# Patient Record
Sex: Male | Born: 1965
Health system: Southern US, Community
[De-identification: ages and names within clinical notes are randomized; demographics above are authoritative.]

## PROBLEM LIST (undated history)

## (undated) DIAGNOSIS — E7800 Pure hypercholesterolemia, unspecified: Secondary | ICD-10-CM

## (undated) DIAGNOSIS — I10 Essential (primary) hypertension: Secondary | ICD-10-CM

## (undated) DIAGNOSIS — E119 Type 2 diabetes mellitus without complications: Secondary | ICD-10-CM

## (undated) HISTORY — PX: NO PAST SURGERIES: SHX2092

---

## 2000-10-25 ENCOUNTER — Encounter: Payer: Self-pay | Admitting: Internal Medicine

## 2000-10-25 ENCOUNTER — Ambulatory Visit (HOSPITAL_COMMUNITY): Admission: RE | Admit: 2000-10-25 | Discharge: 2000-10-25 | Payer: Self-pay | Admitting: Internal Medicine

## 2006-11-14 ENCOUNTER — Ambulatory Visit: Admission: RE | Admit: 2006-11-14 | Discharge: 2006-11-14 | Payer: Self-pay | Admitting: Hematology & Oncology

## 2006-11-29 ENCOUNTER — Ambulatory Visit: Payer: Self-pay | Admitting: Pulmonary Disease

## 2010-08-19 NOTE — Procedures (Signed)
Andrew Duarte, Andrew Duarte               ACCOUNT NO.:  [id number]   MEDICAL RECORD NO.:  [id number]          PATIENT TYPE:  OUT   LOCATION:  SLEEP LAB                     FACILITY:  [facility]   PHYSICIAN:  Kathee Delton, MD,FCCPDATE OF BIRTH:  May 21, 1965   DATE OF STUDY:  11/14/2006                            NOCTURNAL POLYSOMNOGRAM   REFERRING PHYSICIAN:  Shanon Brow L. Wilburn Cornelia, M.D.   REFERRING PHYSICIAN:  Early Chars. Wilburn Cornelia, M.D.   INDICATIONS FOR STUDY:  Hypersomnia with sleep apnea.   RESULTS:  Epworth score is 16.   Sleep architecture:  The patient had a total sleep time of 360 minutes  with decreased slow wave sleep for age and also decreased REM.  Sleep  onset latency was normal and REM onset was normal as well.  Sleep  efficiency was fairly good at 91%.   Respiratory data:  The patient underwent split night protocol where he  was found to have 45 obstructive events in the first 222 minutes of  sleep.  This gave him an apnea/hypopnea index of 12 events per hour over  the first half of the night.  The events occurred primarily in the  supine position and there was loud snoring noted throughout.  By  protocol the patient was then placed on a medium ComfortGel nasal CPAP  mask and titrated ultimately to a final pressure of 7 cm with good  control of events even through supine REM.  The patient then began to  complain of nasal congestion asked for the CPAP to be discontinued for  the rest of the study.   Oxygen data:  There was O2 desaturation as low as 82% with the patient's  obstructive events.   Cardiac:  No clinically significant cardiac arrhythmias were noted.   Movement/parasomnia:  The patient was found have 41 leg jerks with 2.3  per hour resulting in arousal awakening.   IMPRESSION/RECOMMENDATIONS:  The split night study reveals mild  obstructive sleep apnea/hypopnea syndrome with an apnea/hypopnea index  of 12 events per hour and oxygen desaturation as low as 82% during  the  first half of the night.  The patient was then placed on continuous  positive airway pressure  with a medium ComfortGel nasal continuous  positive airway pressure mask and titrated to 7 cmH2O pressure with  excellent control of his obstructive events even through supine REM.  The patient then complained of nasal congestion and asked that the  continuous positive airway pressure be discontinued for the remainder of  the study.  It appears the patient will do  well on a continuous positive airway pressure of 7 if this treatment  modality is chosen.  However, would consider a full face mask in light  of his nasal congestion or possibly improving his nasal airway if this  is an issue.      Kathee Delton, MD,FCCP  Diplomate, [place] Board of Sleep  Medicine  Electronically Signed     [facility]/MEDQ  D:  11/25/2006 18:29:49  T:  11/27/2006 06:37:36  Job:  [id number]

## 2015-04-04 ENCOUNTER — Encounter: Payer: Self-pay | Admitting: Family Medicine

## 2015-04-04 ENCOUNTER — Ambulatory Visit (INDEPENDENT_AMBULATORY_CARE_PROVIDER_SITE_OTHER): Payer: BLUE CROSS/BLUE SHIELD | Admitting: Family Medicine

## 2015-04-04 VITALS — BP 122/80 | Temp 99.4°F | Wt 212.1 lb

## 2015-04-04 DIAGNOSIS — Z125 Encounter for screening for malignant neoplasm of prostate: Secondary | ICD-10-CM | POA: Diagnosis not present

## 2015-04-04 DIAGNOSIS — L729 Follicular cyst of the skin and subcutaneous tissue, unspecified: Secondary | ICD-10-CM

## 2015-04-04 DIAGNOSIS — Z79899 Other long term (current) drug therapy: Secondary | ICD-10-CM | POA: Diagnosis not present

## 2015-04-04 DIAGNOSIS — Z13220 Encounter for screening for lipoid disorders: Secondary | ICD-10-CM | POA: Diagnosis not present

## 2015-04-04 DIAGNOSIS — R739 Hyperglycemia, unspecified: Secondary | ICD-10-CM

## 2015-04-04 LAB — POCT GLUCOSE (DEVICE FOR HOME USE): POC Glucose: 186 mg/dl — AB (ref 70–99)

## 2015-04-04 NOTE — Progress Notes (Signed)
   Subjective:    Patient ID: Andrew Duarte, male    DOB: Nov 23, 1965, 49 y.o.   MRN: [id number]  HPI Patient is here today because he has a knot on his forehead. Patient states that it has been present for over 1 year now.  No pain noted. Treatments tried: none. Came up abpout a yr ago  Pt thought he hit his head there, but then it did not go away   [id number] Patient states that he has no other concerns at this time.   Patient has also not been seen for several years despite diagnosis of type 2 diabetes Review of Systems No headache no chest pain no shortness breath no abdominal pain ROS otherwise negative    Objective:   Physical Exam  Alert vitals stable. HEENT cyst versus lipoma right anterior forehead lungs clear heart rare rhythm.  glucose 186      Assessment & Plan:  Impression subcutaneous cyst discussed #2 type 2 diabetes with noncompliance discussed plan appropriate blood work diet exercise discussed dermatology referral [facility]

## 2015-04-10 ENCOUNTER — Encounter: Payer: Self-pay | Admitting: Family Medicine

## 2015-04-29 LAB — BASIC METABOLIC PANEL
BUN/Creatinine Ratio: 8 — ABNORMAL LOW (ref 9–20)
BUN: 9 mg/dL (ref 6–24)
CO2: 22 mmol/L (ref 18–29)
Calcium: 9.7 mg/dL (ref 8.7–10.2)
Chloride: 99 mmol/L (ref 96–106)
Creatinine, Ser: 1.13 mg/dL (ref 0.76–1.27)
GFR calc Af Amer: 87 mL/min/{1.73_m2} (ref >59)
GFR calc non Af Amer: 75 mL/min/{1.73_m2} (ref >59)
Glucose: 275 mg/dL — ABNORMAL HIGH (ref 65–99)
Potassium: 4.5 mmol/L (ref 3.5–5.2)
Sodium: 140 mmol/L (ref 134–144)

## 2015-04-29 LAB — HEPATIC FUNCTION PANEL
ALT: 38 IU/L (ref 0–44)
AST: 37 IU/L (ref 0–40)
Albumin: 4.5 g/dL (ref 3.5–5.5)
Alkaline Phosphatase: 66 IU/L (ref 39–117)
Bilirubin Total: 0.6 mg/dL (ref 0.0–1.2)
Bilirubin, Direct: 0.15 mg/dL (ref 0.00–0.40)
Total Protein: 7.6 g/dL (ref 6.0–8.5)

## 2015-04-29 LAB — LIPID PANEL
Chol/HDL Ratio: 7 ratio units — ABNORMAL HIGH (ref 0.0–5.0)
Cholesterol, Total: 231 mg/dL — ABNORMAL HIGH (ref 100–199)
HDL: 33 mg/dL — ABNORMAL LOW (ref >39)
LDL Calculated: 152 mg/dL — ABNORMAL HIGH (ref 0–99)
Triglycerides: 230 mg/dL — ABNORMAL HIGH (ref 0–149)
VLDL Cholesterol Cal: 46 mg/dL — ABNORMAL HIGH (ref 5–40)

## 2015-04-29 LAB — HEMOGLOBIN A1C
Est. average glucose Bld gHb Est-mCnc: 341 mg/dL
Hgb A1c MFr Bld: 13.5 % — ABNORMAL HIGH (ref 4.8–5.6)

## 2015-04-29 LAB — PSA: Prostate Specific Ag, Serum: 0.5 ng/mL (ref 0.0–4.0)

## 2015-05-03 ENCOUNTER — Ambulatory Visit (INDEPENDENT_AMBULATORY_CARE_PROVIDER_SITE_OTHER): Payer: BLUE CROSS/BLUE SHIELD | Admitting: Family Medicine

## 2015-05-03 ENCOUNTER — Encounter: Payer: Self-pay | Admitting: Family Medicine

## 2015-05-03 VITALS — BP 146/92 | Ht 71.0 in | Wt 215.4 lb

## 2015-05-03 DIAGNOSIS — E785 Hyperlipidemia, unspecified: Secondary | ICD-10-CM | POA: Insufficient documentation

## 2015-05-03 DIAGNOSIS — R030 Elevated blood-pressure reading, without diagnosis of hypertension: Secondary | ICD-10-CM

## 2015-05-03 DIAGNOSIS — E119 Type 2 diabetes mellitus without complications: Secondary | ICD-10-CM

## 2015-05-03 DIAGNOSIS — IMO0001 Reserved for inherently not codable concepts without codable children: Secondary | ICD-10-CM | POA: Insufficient documentation

## 2015-05-03 MED ORDER — BLOOD GLUCOSE MONITOR SYSTEM W/DEVICE KIT: PACK | Status: AC

## 2015-05-03 MED ORDER — METFORMIN HCL 500 MG PO TABS: 500.0000 mg | ORAL_TABLET | Freq: Two times a day (BID) | ORAL | Status: DC

## 2015-05-03 NOTE — Progress Notes (Signed)
Subjective:    Patient ID: Andrew Duarte, male    DOB: 1965-06-27, 50 y.o.   MRN: [id number] Patient arrives office for follow-up in regards to last visit with multiple concerns.   HPI Patient in today for a 4 week recheck of cyst of subcutaneous tissue to forehead. A bit worried that the dermatologists in [place] recommended he see a Psychiatric nurse in [place]. I advised patient this is a common precaution and they feel the cysts are fairly deep under the scalp  History of elevated cholesterol and the past. Wasn't sure how high. Admits to poor generic dietary practices with a lot of fats in diet.  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  Admits to poor dietary compliance over the past half year  Patient had recent labs drawn on 04/27/15  States no other concerns this visit.  Results for orders placed or performed in visit on 04/04/15  Lipid panel  Result Value Ref Range   Cholesterol, Total 231 (H) 100 - 199 mg/dL   Triglycerides 230 (H) 0 - 149 mg/dL   HDL 33 (L) >39 mg/dL   VLDL Cholesterol Cal 46 (H) 5 - 40 mg/dL   LDL Calculated 152 (H) 0 - 99 mg/dL   Chol/HDL Ratio 7.0 (H) 0.0 - 5.0 ratio units  Hepatic function panel  Result Value Ref Range   Total Protein 7.6 6.0 - 8.5 g/dL   Albumin 4.5 3.5 - 5.5 g/dL   Bilirubin Total 0.6 0.0 - 1.2 mg/dL   Bilirubin, Direct 0.15 0.00 - 0.40 mg/dL   Alkaline Phosphatase 66 39 - 117 IU/L   AST 37 0 - 40 IU/L   ALT 38 0 - 44 IU/L  Basic metabolic panel  Result Value Ref Range   Glucose 275 (H) 65 - 99 mg/dL   BUN 9 6 - 24 mg/dL   Creatinine, Ser 1.13 0.76 - 1.27 mg/dL   GFR calc non Af Amer 75 >59 mL/min/1.73   GFR calc Af Amer 87 >59 mL/min/1.73   BUN/Creatinine Ratio 8 (L) 9 - 20   Sodium 140 134 - 144 mmol/L   Potassium 4.5 3.5 - 5.2 mmol/L   Chloride 99 96 - 106 mmol/L   CO2 22 18 - 29 mmol/L   Calcium 9.7 8.7 - 10.2 mg/dL  PSA  Result Value Ref Range   Prostate Specific Ag, Serum 0.5  0.0 - 4.0 ng/mL  Hemoglobin A1c  Result Value Ref Range   Hgb A1c MFr Bld 13.5 (H) 4.8 - 5.6 %   Est. average glucose Bld gHb Est-mCnc 341 mg/dL  POCT Glucose (Device for Home Use)  Result Value Ref Range   Glucose Fasting, POC  70 - 99 mg/dL   POC Glucose 186 (A) 70 - 99 mg/dl     Review of Systems No chest pain positive polyuria positive polydipsia no polyphasia no abdominal pain no change in bowel habits    Objective:   Physical Exam Alert blood pressure improved on recheck if still elevated 130/90 HEENT normal neck supple. Lungs clear heart rare rhythm ankles without edema  C diabetic foot exam     Assessment & Plan:  Impression 1 type 2 diabetes. Discussed at length. Poor control. Time to initiate medicine rationale discussed #2 elevated blood pressure. At this time not high enough for meds importance of control discussed with diabetes #3 hyperlipidemia suboptimal in. We'll need to work hard educational information given. If lipids remain elevated when rechecked  will need medication plan initiate metformin 500 twice a day 3 days then 2 twice a day. Screen sugars every morning rationale discussed. Diet exercise discussed encourage. Glucometer prescribed check sugars regularly. Educational session to hospital. Review [facility]

## 2015-05-03 NOTE — Patient Instructions (Signed)
Basic Carbohydrate Counting for Diabetes Mellitus Carbohydrate counting is a method for keeping track of the amount of carbohydrates you eat. Eating carbohydrates naturally increases the level of sugar (glucose) in your blood, so it is important for you to know the amount that is okay for you to have in every meal. Carbohydrate counting helps keep the level of glucose in your blood within normal limits. The amount of carbohydrates allowed is different for every person. A dietitian can help you calculate the amount that is right for you. Once you know the amount of carbohydrates you can have, you can count the carbohydrates in the foods you want to eat. Carbohydrates are found in the following foods:  Grains, such as breads and cereals.  Dried beans and soy products.  Starchy vegetables, such as potatoes, peas, and corn.  Fruit and fruit juices.  Milk and yogurt.  Sweets and snack foods, such as cake, cookies, candy, chips, soft drinks, and fruit drinks. CARBOHYDRATE COUNTING There are two ways to count the carbohydrates in your food. You can use either of the methods or a combination of both. Reading the "Nutrition Facts" on Packaged Food The "Nutrition Facts" is an area that is included on the labels of almost all packaged food and beverages in the [place]. It includes the serving size of that food or beverage and information about the nutrients in each serving of the food, including the grams (g) of carbohydrate per serving.  Decide the number of servings of this food or beverage that you will be able to eat or drink. Multiply that number of servings by the number of grams of carbohydrate that is listed on the label for that serving. The total will be the amount of carbohydrates you will be having when you eat or drink this food or beverage. Learning Standard Serving Sizes of Food When you eat food that is not packaged or does not include "Nutrition Facts" on the label, you need to  measure the servings in order to count the amount of carbohydrates.A serving of most carbohydrate-rich foods contains about 15 g of carbohydrates. The following list includes serving sizes of carbohydrate-rich foods that provide 15 g ofcarbohydrate per serving:   1 slice of bread (1 oz) or 1 six-inch tortilla.    of a hamburger bun or English muffin.  4-6 crackers.   cup unsweetened dry cereal.    cup hot cereal.   cup rice or pasta.    cup mashed potatoes or  of a large baked potato.  1 cup fresh fruit or one small piece of fruit.    cup canned or frozen fruit or fruit juice.  1 cup milk.   cup plain fat-free yogurt or yogurt sweetened with artificial sweeteners.   cup cooked dried beans or starchy vegetable, such as peas, corn, or potatoes.  Decide the number of standard-size servings that you will eat. Multiply that number of servings by 15 (the grams of carbohydrates in that serving). For example, if you eat 2 cups of strawberries, you will have eaten 2 servings and 30 g of carbohydrates (2 servings x 15 g = 30 g). For foods such as soups and casseroles, in which more than one food is mixed in, you will need to count the carbohydrates in each food that is included. EXAMPLE OF CARBOHYDRATE COUNTING Sample Dinner  3 oz chicken breast.   cup of brown rice.   cup of corn.  1 cup milk.   1 cup strawberries with   sugar-free whipped topping.  Carbohydrate Calculation Step 1: Identify the foods that contain carbohydrates:   Rice.   Corn.   Milk.   Strawberries. Step 2:Calculate the number of servings eaten of each:   2 servings of rice.   1 serving of corn.   1 serving of milk.   1 serving of strawberries. Step 3: Multiply each of those number of servings by 15 g:   2 servings of rice x 15 g = 30 g.   1 serving of corn x 15 g = 15 g.   1 serving of milk x 15 g = 15 g.   1 serving of strawberries x 15 g = 15 g. Step 4: Add  together all of the amounts to find the total grams of carbohydrates eaten: 30 g + 15 g + 15 g + 15 g = 75 g.   This information is not intended to replace advice given to you by your health care provider. Make sure you discuss any questions you have with your health care provider.   Document Released: 03/23/2005 Document Revised: 04/13/2014 Document Reviewed: 02/17/2013 Elsevier Interactive Patient Education 2016 [place]. High Cholesterol High cholesterol refers to having a high level of cholesterol in your blood. Cholesterol is a white, waxy, fat-like protein that your body needs in small amounts. Your liver makes all the cholesterol you need. Excess cholesterol comes from the food you eat. Cholesterol travels in your bloodstream through your blood vessels. If you have high cholesterol, deposits (plaque) may build up on the walls of your blood vessels. This makes the arteries narrower and stiffer. Plaque increases your risk of heart attack and stroke. Work with your health care provider to keep your cholesterol levels in a healthy range. RISK FACTORS Several things can make you more likely to have high cholesterol. These include:   Eating foods high in animal fat (saturated fat) or cholesterol.  Being overweight.  Not getting enough exercise.  Having a family history of high cholesterol. SIGNS AND SYMPTOMS High cholesterol does not cause symptoms. DIAGNOSIS  Your health care provider can do a blood test to check whether you have high cholesterol. If you are older than 20, your health care provider may check your cholesterol every 4-6 years. You may be checked more often if you already have high cholesterol or other risk factors for heart disease. The blood test for cholesterol measures the following:  Bad cholesterol (LDL cholesterol). This is the type of cholesterol that causes heart disease. This number should be less than 100.  Good cholesterol (HDL cholesterol). This type helps  protect against heart disease. A healthy level of HDL cholesterol is 60 or higher.  Total cholesterol. This is the combined number of LDL cholesterol and HDL cholesterol. A healthy number is less than 200. TREATMENT  High cholesterol can be treated with diet changes, lifestyle changes, and medicine.   Diet changes may include eating more whole grains, fruits, vegetables, nuts, and fish. You may also have to cut back on red meat and foods with a lot of added sugar.  Lifestyle changes may include getting at least 40 minutes of aerobic exercise three times a week. Aerobic exercises include walking, biking, and swimming. Aerobic exercise along with a healthy diet can help you maintain a healthy weight. Lifestyle changes may also include quitting smoking.  If diet and lifestyle changes are not enough to lower your cholesterol, your health care provider may prescribe a statin medicine. This medicine has been shown  to lower cholesterol and also lower the risk of heart disease. HOME CARE INSTRUCTIONS  Only take over-the-counter or prescription medicines as directed by your health care provider.   Follow a healthy diet as directed by your health care provider. For instance:   Eat chicken (without skin), fish, veal, shellfish, ground [place] breast, and round or loin cuts of red meat.  Do not eat fried foods and fatty meats, such as hot dogs and salami.   Eat plenty of fruits, such as apples.   Eat plenty of vegetables, such as broccoli, potatoes, and carrots.   Eat beans, peas, and lentils.   Eat grains, such as barley, rice, couscous, and bulgur wheat.   Eat pasta without cream sauces.   Use skim or nonfat milk and low-fat or nonfat yogurt and cheeses. Do not eat or drink whole milk, cream, ice cream, egg yolks, and hard cheeses.   Do not eat stick margarine or tub margarines that contain trans fats (also called partially hydrogenated oils).   Do not eat cakes, cookies, crackers,  or other baked goods that contain trans fats.   Do not eat saturated tropical oils, such as coconut and palm oil.   Exercise as directed by your health care provider. Increase your activity level with activities such as gardening or walking.   Keep all follow-up appointments.  SEEK MEDICAL CARE IF:  You are struggling to maintain a healthy diet or weight.  You need help starting an exercise program.  You need help to stop smoking. SEEK IMMEDIATE MEDICAL CARE IF:  You have chest pain.  You have trouble breathing.   This information is not intended to replace advice given to you by your health care provider. Make sure you discuss any questions you have with your health care provider.   Document Released: 03/23/2005 Document Revised: 04/13/2014 Document Reviewed: 01/13/2013 Elsevier Interactive Patient Education [employer].

## 2015-08-01 ENCOUNTER — Encounter: Payer: Self-pay | Admitting: Family Medicine

## 2015-08-01 ENCOUNTER — Ambulatory Visit (INDEPENDENT_AMBULATORY_CARE_PROVIDER_SITE_OTHER): Payer: BLUE CROSS/BLUE SHIELD | Admitting: Family Medicine

## 2015-08-01 VITALS — BP 134/80 | Ht 71.0 in | Wt 220.0 lb

## 2015-08-01 DIAGNOSIS — R030 Elevated blood-pressure reading, without diagnosis of hypertension: Secondary | ICD-10-CM | POA: Diagnosis not present

## 2015-08-01 DIAGNOSIS — E119 Type 2 diabetes mellitus without complications: Secondary | ICD-10-CM

## 2015-08-01 DIAGNOSIS — E785 Hyperlipidemia, unspecified: Secondary | ICD-10-CM | POA: Diagnosis not present

## 2015-08-01 DIAGNOSIS — IMO0001 Reserved for inherently not codable concepts without codable children: Secondary | ICD-10-CM

## 2015-08-01 LAB — POCT GLYCOSYLATED HEMOGLOBIN (HGB A1C): Hemoglobin A1C: 9.2

## 2015-08-01 MED ORDER — METFORMIN HCL 500 MG PO TABS: ORAL_TABLET | ORAL | Status: DC

## 2015-08-01 NOTE — Progress Notes (Signed)
   Subjective:    Patient ID: Andrew Duarte, male    DOB: 14-Mar-1966, 50 y.o.   MRN: [id number]  Diabetes He presents for his follow-up diabetic visit. He has type 2 diabetes mellitus. No MedicAlert identification noted. He has not had a previous visit with a dietitian. He does not see a podiatrist.Eye exam is not current.   Patient states no concerns this visit.  Results for orders placed or performed in visit on 08/01/15  POCT HgB A1C  Result Value Ref Range   Hemoglobin A1C 9.2     Results for orders placed or performed in visit on 08/01/15  POCT HgB A1C  Result Value Ref Range   Hemoglobin A1C 9.2     Review of Systems No headache, no major weight loss or weight gain, no chest pain no back pain abdominal pain no change in bowel habits complete ROS otherwise negative     Objective:   Physical Exam  Alert vitals stable HEENT normal. Lungs clear. Heart rare rhythm ankles without edema feet pulses good sensation intact      Assessment & Plan:  Impression type 2 diabetes control improving, but with some recent noncompliance with medications #2 elevated blood pressure resolved #3 hyperlipidemia, most recent numbers and all the ramifications discussed with patient at length. We'll need to press on with medications if definitely concerned discuss next level needs to show improvement. 25 minutes spent most in discussion

## 2015-08-22 DIAGNOSIS — Z713 Dietary counseling and surveillance: Secondary | ICD-10-CM | POA: Diagnosis not present

## 2015-10-24 DIAGNOSIS — E119 Type 2 diabetes mellitus without complications: Secondary | ICD-10-CM | POA: Diagnosis not present

## 2015-10-24 DIAGNOSIS — E785 Hyperlipidemia, unspecified: Secondary | ICD-10-CM | POA: Diagnosis not present

## 2015-10-25 LAB — HEPATIC FUNCTION PANEL
ALT: 37 IU/L (ref 0–44)
AST: 32 IU/L (ref 0–40)
Albumin: 4.7 g/dL (ref 3.5–5.5)
Alkaline Phosphatase: 51 IU/L (ref 39–117)
Bilirubin Total: 0.3 mg/dL (ref 0.0–1.2)
Bilirubin, Direct: 0.13 mg/dL (ref 0.00–0.40)
Total Protein: 8.2 g/dL (ref 6.0–8.5)

## 2015-10-25 LAB — LIPID PANEL
Chol/HDL Ratio: 5.6 ratio units — ABNORMAL HIGH (ref 0.0–5.0)
Cholesterol, Total: 196 mg/dL (ref 100–199)
HDL: 35 mg/dL — ABNORMAL LOW (ref >39)
LDL Calculated: 124 mg/dL — ABNORMAL HIGH (ref 0–99)
Triglycerides: 187 mg/dL — ABNORMAL HIGH (ref 0–149)
VLDL Cholesterol Cal: 37 mg/dL (ref 5–40)

## 2015-10-25 LAB — MICROALBUMIN / CREATININE URINE RATIO
Creatinine, Urine: 474.9 mg/dL
MICROALB/CREAT RATIO: 473.2 mg/g creat — ABNORMAL HIGH (ref 0.0–30.0)
Microalbumin, Urine: 2247.3 ug/mL

## 2015-10-28 ENCOUNTER — Ambulatory Visit (INDEPENDENT_AMBULATORY_CARE_PROVIDER_SITE_OTHER): Payer: BLUE CROSS/BLUE SHIELD | Admitting: Family Medicine

## 2015-10-28 ENCOUNTER — Encounter: Payer: Self-pay | Admitting: Family Medicine

## 2015-10-28 VITALS — BP 142/88 | Ht 70.0 in | Wt 212.8 lb

## 2015-10-28 DIAGNOSIS — E119 Type 2 diabetes mellitus without complications: Secondary | ICD-10-CM

## 2015-10-28 LAB — POCT GLYCOSYLATED HEMOGLOBIN (HGB A1C): Hemoglobin A1C: 7.8

## 2015-10-28 MED ORDER — METFORMIN HCL 500 MG PO TABS: ORAL_TABLET | ORAL | 5 refills | Status: DC

## 2015-10-28 NOTE — Progress Notes (Signed)
   Subjective:    Patient ID: Andrew Duarte, male    DOB: 1966/02/20, 50 y.o.   MRN: [id number]  Diabetes  He presents for his follow-up diabetic visit. He has type 2 diabetes mellitus. Risk factors for coronary artery disease include diabetes mellitus. Current diabetic treatment includes oral agent (monotherapy). He is compliant with treatment all of the time. His weight is stable. He is following a diabetic diet. He has had a previous visit with a dietitian (at work). He does not see a podiatrist.Eye exam is not current.   Results for orders placed or performed in visit on 10/28/15  POCT glycosylated hemoglobin (Hb A1C)  Result Value Ref Range   Hemoglobin A1C 7.8     morn numbers 112 130 140 177 or soiilar  Three d per wk    Eating right things watching  Has cut down on sodas  Misses rarely on the meds   Results for orders placed or performed in visit on 10/28/15  POCT glycosylated hemoglobin (Hb A1C)  Result Value Ref Range   Hemoglobin A1C 7.8     Discuss recent labs  Review of Systems No headache, no major weight loss or weight gain, no chest pain no back pain abdominal pain no change in bowel habits complete ROS otherwise negative     Objective:   Physical Exam Alert vitals stable, NAD. Blood pressure good on repeat. HEENT normal. Lungs clear. Heart regular rate and rhythm.        Assessment & Plan:  Impression type 2 diabetes suboptimum but much improved discussed plan patient would like several more months before considering further medicine. Diet exercise discussed [facility]

## 2016-02-24 ENCOUNTER — Ambulatory Visit (INDEPENDENT_AMBULATORY_CARE_PROVIDER_SITE_OTHER): Payer: BLUE CROSS/BLUE SHIELD | Admitting: Family Medicine

## 2016-02-24 ENCOUNTER — Encounter: Payer: Self-pay | Admitting: Family Medicine

## 2016-02-24 VITALS — BP 160/98 | Ht 70.0 in | Wt 218.0 lb

## 2016-02-24 DIAGNOSIS — I10 Essential (primary) hypertension: Secondary | ICD-10-CM

## 2016-02-24 DIAGNOSIS — E1121 Type 2 diabetes mellitus with diabetic nephropathy: Secondary | ICD-10-CM

## 2016-02-24 DIAGNOSIS — E119 Type 2 diabetes mellitus without complications: Secondary | ICD-10-CM

## 2016-02-24 LAB — POCT GLYCOSYLATED HEMOGLOBIN (HGB A1C): Hemoglobin A1C: 7.5

## 2016-02-24 MED ORDER — ENALAPRIL MALEATE 10 MG PO TABS: 10.0000 mg | ORAL_TABLET | Freq: Every evening | ORAL | 5 refills | Status: DC

## 2016-02-24 MED ORDER — METFORMIN HCL 500 MG PO TABS: ORAL_TABLET | ORAL | 5 refills | Status: DC

## 2016-02-24 MED ORDER — GLIPIZIDE 5 MG PO TABS: 5.0000 mg | ORAL_TABLET | Freq: Every day | ORAL | 5 refills | Status: DC

## 2016-02-24 NOTE — Progress Notes (Signed)
   Subjective:    Patient ID: Andrew Duarte, male    DOB: [birth year], 50 y.o.   MRN: [id number]  Diabetes  He presents for his follow-up diabetic visit. He has type 2 diabetes mellitus. Current diabetic treatments: metformin. He is compliant with treatment all of the time. He participates in exercise intermittently. His breakfast blood glucose range is generally 130-140 mg/dl. He does not see a podiatrist.Eye exam is not current.   Results for orders placed or performed in visit on 02/24/16  POCT glycosylated hemoglobin (Hb A1C)  Result Value Ref Range   Hemoglobin A1C 7.5    Pt declines flu vaccine.   Concerns about swelling on forehead.   120 to [place] with work    Patient has had intermittent elevated blood pressures rising more in the past year. Positive family history of hypertension. Unfortunately uses plenty of salt  Micro-protein and urine specimen revealed positive for micro-proteinuria. Substantially. Blood work showed good kidney function      No eye doc for anther mo   Forehead swelling, patient had surgery. Probable lipoma of the forehead. Now some residual swelling has returned. Patient concerned about this.   Review of Systems No headache, no major weight loss or weight gain, no chest pain no back pain abdominal pain no change in bowel habits complete ROS otherwise negative     Objective:   Physical Exam  Alert vitals stable, NAD. Blood pressure good on repeat. HEENT normal. Lungs clear. Heart regular rate and rhythm. Forehead reveals surgical scar with mild soft palpable cyst underneath  Ankles no significant edema  C diabetic foot exam      Assessment & Plan:  Impression 1 type 2 diabetes suboptimum discuss time to add another medication #2 hypertension new diagnosis developing over the past year. Time to initiate medicine #3 micro-proteinuria substantial discuss we can use a medication from both #2 and #3 #4 surgical swelling benign  expect gradual resolution discussed plan initiate enalapril 10 daily at bedtime. Initiate Glucotrol 5 every morning. Diet exercise discussed.

## 2016-06-23 ENCOUNTER — Ambulatory Visit (INDEPENDENT_AMBULATORY_CARE_PROVIDER_SITE_OTHER): Payer: BLUE CROSS/BLUE SHIELD | Admitting: Family Medicine

## 2016-06-23 ENCOUNTER — Encounter: Payer: Self-pay | Admitting: Family Medicine

## 2016-06-23 VITALS — BP 142/90 | Ht 70.0 in | Wt 223.4 lb

## 2016-06-23 DIAGNOSIS — E119 Type 2 diabetes mellitus without complications: Secondary | ICD-10-CM

## 2016-06-23 DIAGNOSIS — Z125 Encounter for screening for malignant neoplasm of prostate: Secondary | ICD-10-CM | POA: Diagnosis not present

## 2016-06-23 DIAGNOSIS — Z13220 Encounter for screening for lipoid disorders: Secondary | ICD-10-CM

## 2016-06-23 DIAGNOSIS — I10 Essential (primary) hypertension: Secondary | ICD-10-CM

## 2016-06-23 DIAGNOSIS — Z79899 Other long term (current) drug therapy: Secondary | ICD-10-CM | POA: Diagnosis not present

## 2016-06-23 DIAGNOSIS — E7800 Pure hypercholesterolemia, unspecified: Secondary | ICD-10-CM

## 2016-06-23 DIAGNOSIS — E1121 Type 2 diabetes mellitus with diabetic nephropathy: Secondary | ICD-10-CM

## 2016-06-23 LAB — POCT GLYCOSYLATED HEMOGLOBIN (HGB A1C): Hemoglobin A1C: 6.6

## 2016-06-23 MED ORDER — GLIPIZIDE 5 MG PO TABS: 5.0000 mg | ORAL_TABLET | Freq: Every day | ORAL | 5 refills | Status: DC

## 2016-06-23 MED ORDER — ENALAPRIL MALEATE 20 MG PO TABS: 20.0000 mg | ORAL_TABLET | Freq: Every day | ORAL | 5 refills | Status: DC

## 2016-06-23 MED ORDER — METFORMIN HCL 500 MG PO TABS: ORAL_TABLET | ORAL | 5 refills | Status: DC

## 2016-06-23 NOTE — Progress Notes (Signed)
   Subjective:    Patient ID: Andrew Duarte, male    DOB: 11-27-1965, 51 y.o.   MRN: [id number]  Diabetes  He presents for his follow-up diabetic visit. He has type 2 diabetes mellitus. Risk factors for coronary artery disease include diabetes mellitus and hypertension. Current diabetic treatment includes oral agent (dual therapy). He is compliant with treatment all of the time. His weight is stable. He is following a diabetic diet. He has not had a previous visit with a dietitian. He participates in exercise three times a week. He does not see a podiatrist.Eye exam is not current.    Blood pressure medicine and blood pressure levels reviewed today with patient. Compliant with blood pressure medicine. States does not miss a dose. No obvious side effects. Blood pressure generally good when checked elsewhere. Watching salt intake.  Patient claims compliance with diabetes medication. No obvious side effects. Reports no substantial low sugar spells. Most numbers are generally in good range when checked fasting. Generally does not miss a dose of medication. Watching diabetic diet closely  Results for orders placed or performed in visit on 06/23/16  POCT glycosylated hemoglobin (Hb A1C)  Result Value Ref Range   Hemoglobin A1C 6.6    Morn numbers great 90s and 80s , overall quite good   Positive history of elevated cholesterol. Reports plus minus diet. Also reports some family history of high cholesterol  Review of Systems No headache, no major weight loss or weight gain, no chest pain no back pain abdominal pain no change in bowel habits complete ROS otherwise negative     Objective:   Physical Exam  Alert vitals stable, NAD. Blood pressure good on repeat. HEENT normal. Lungs clear. Heart regular rate and rhythm.       Assessment & Plan:  Pt to [facility] Impression 1 type 2 diabetes clinically improved good control discussed 2 hypertension suboptimal control discussed to increase  same meds #3 hyperlipidemia discussed at length. LDL is simply too high last time. Still this I will recommend medication. Rationale discussed at length with patient. #4 proteinuria. Likely related to diabetes. Another good reason for patient being on an ACE inhibitor discussed   Greater than 50% of this 25 minute face to face visit was spent in counseling and discussion and coordination of care regarding the above diagnosis/diagnosies

## 2016-10-23 DIAGNOSIS — Z713 Dietary counseling and surveillance: Secondary | ICD-10-CM | POA: Diagnosis not present

## 2016-11-15 ENCOUNTER — Other Ambulatory Visit: Payer: Self-pay | Admitting: Family Medicine

## 2016-12-16 DIAGNOSIS — E119 Type 2 diabetes mellitus without complications: Secondary | ICD-10-CM | POA: Diagnosis not present

## 2016-12-16 DIAGNOSIS — Z13220 Encounter for screening for lipoid disorders: Secondary | ICD-10-CM | POA: Diagnosis not present

## 2016-12-16 DIAGNOSIS — Z125 Encounter for screening for malignant neoplasm of prostate: Secondary | ICD-10-CM | POA: Diagnosis not present

## 2016-12-16 DIAGNOSIS — Z79899 Other long term (current) drug therapy: Secondary | ICD-10-CM | POA: Diagnosis not present

## 2016-12-17 LAB — BASIC METABOLIC PANEL
BUN/Creatinine Ratio: 10 (ref 9–20)
BUN: 13 mg/dL (ref 6–24)
CO2: 22 mmol/L (ref 20–29)
Calcium: 10.1 mg/dL (ref 8.7–10.2)
Chloride: 97 mmol/L (ref 96–106)
Creatinine, Ser: 1.26 mg/dL (ref 0.76–1.27)
GFR calc Af Amer: 75 mL/min/{1.73_m2} (ref >59)
GFR calc non Af Amer: 65 mL/min/{1.73_m2} (ref >59)
Glucose: 76 mg/dL (ref 65–99)
Potassium: 4 mmol/L (ref 3.5–5.2)
Sodium: 137 mmol/L (ref 134–144)

## 2016-12-17 LAB — LIPID PANEL
Chol/HDL Ratio: 6.2 ratio — ABNORMAL HIGH (ref 0.0–5.0)
Cholesterol, Total: 211 mg/dL — ABNORMAL HIGH (ref 100–199)
HDL: 34 mg/dL — ABNORMAL LOW (ref >39)
LDL Calculated: 147 mg/dL — ABNORMAL HIGH (ref 0–99)
Triglycerides: 151 mg/dL — ABNORMAL HIGH (ref 0–149)
VLDL Cholesterol Cal: 30 mg/dL (ref 5–40)

## 2016-12-17 LAB — HEPATIC FUNCTION PANEL
ALT: 30 IU/L (ref 0–44)
AST: 30 IU/L (ref 0–40)
Albumin: 4.9 g/dL (ref 3.5–5.5)
Alkaline Phosphatase: 41 IU/L (ref 39–117)
Bilirubin Total: 0.4 mg/dL (ref 0.0–1.2)
Bilirubin, Direct: 0.11 mg/dL (ref 0.00–0.40)
Total Protein: 8 g/dL (ref 6.0–8.5)

## 2016-12-17 LAB — PSA: Prostate Specific Ag, Serum: 0.5 ng/mL (ref 0.0–4.0)

## 2016-12-24 ENCOUNTER — Ambulatory Visit (INDEPENDENT_AMBULATORY_CARE_PROVIDER_SITE_OTHER): Payer: BLUE CROSS/BLUE SHIELD | Admitting: Family Medicine

## 2016-12-24 ENCOUNTER — Encounter: Payer: Self-pay | Admitting: Family Medicine

## 2016-12-24 VITALS — BP 140/88 | Ht 70.0 in | Wt 221.6 lb

## 2016-12-24 DIAGNOSIS — E119 Type 2 diabetes mellitus without complications: Secondary | ICD-10-CM

## 2016-12-24 DIAGNOSIS — E785 Hyperlipidemia, unspecified: Secondary | ICD-10-CM | POA: Diagnosis not present

## 2016-12-24 DIAGNOSIS — I10 Essential (primary) hypertension: Secondary | ICD-10-CM

## 2016-12-24 LAB — POCT GLYCOSYLATED HEMOGLOBIN (HGB A1C): Hemoglobin A1C: 6.3

## 2016-12-24 MED ORDER — ATORVASTATIN CALCIUM 40 MG PO TABS: 40.0000 mg | ORAL_TABLET | Freq: Every day | ORAL | 5 refills | Status: DC

## 2016-12-24 MED ORDER — GLIPIZIDE 5 MG PO TABS: 5.0000 mg | ORAL_TABLET | Freq: Every day | ORAL | 5 refills | Status: DC

## 2016-12-24 MED ORDER — METFORMIN HCL 500 MG PO TABS: ORAL_TABLET | ORAL | 5 refills | Status: DC

## 2016-12-24 MED ORDER — ENALAPRIL MALEATE 20 MG PO TABS
20.0000 mg | ORAL_TABLET | Freq: Every day | ORAL | 5 refills | Status: DC
Start: 2016-12-24 — End: 2017-06-22

## 2016-12-24 NOTE — Progress Notes (Signed)
Subjective:    Patient ID: Andrew Duarte, male    DOB: 1965/08/13, 51 y.o.   MRN: [id number] Patient presents with multiple concerns Diabetes  He presents for his follow-up diabetic visit. He has type 2 diabetes mellitus. Risk factors for coronary artery disease include diabetes mellitus and hypertension. Current diabetic treatment includes oral agent (dual therapy). He is compliant with treatment all of the time. His weight is stable. He is following a diabetic diet. He has not had a previous visit with a dietitian. He does not see a podiatrist.Eye exam is not current (sometime in October).   .sal Results for orders placed or performed in visit on 12/24/16  POCT glycosylated hemoglobin (Hb A1C)  Result Value Ref Range   Hemoglobin A1C 6.3    Exercising a lot with work, not sas much   Recent Results (from the past 2160 hour(s))  Lipid panel     Status: Abnormal   Collection Time: 12/16/16  2:20 PM  Result Value Ref Range   Cholesterol, Total 211 (H) 100 - 199 mg/dL   Triglycerides 151 (H) 0 - 149 mg/dL   HDL 34 (L) >39 mg/dL   VLDL Cholesterol Cal 30 5 - 40 mg/dL   LDL Calculated 147 (H) 0 - 99 mg/dL   Chol/HDL Ratio 6.2 (H) 0.0 - 5.0 ratio    Comment:                                   T. Chol/HDL Ratio                                             Men  Women                               1/2 Avg.Risk  3.4    3.3                                   Avg.Risk  5.0    4.4                                2X Avg.Risk  9.6    7.1                                3X Avg.Risk 23.4   11.0   Hepatic function panel     Status: None   Collection Time: 12/16/16  2:20 PM  Result Value Ref Range   Total Protein 8.0 6.0 - 8.5 g/dL   Albumin 4.9 3.5 - 5.5 g/dL   Bilirubin Total 0.4 0.0 - 1.2 mg/dL   Bilirubin, Direct 0.11 0.00 - 0.40 mg/dL   Alkaline Phosphatase 41 39 - 117 IU/L   AST 30 0 - 40 IU/L   ALT 30 0 - 44 IU/L  Basic metabolic panel     Status: None   Collection Time: 12/16/16  2:20  PM  Result Value Ref Range   Glucose 76 65 - 99 mg/dL   BUN 13 6 - 24 mg/dL   Creatinine,  Ser 1.26 0.76 - 1.27 mg/dL   GFR calc non Af Amer 65 >59 mL/min/1.73   GFR calc Af Amer 75 >59 mL/min/1.73   BUN/Creatinine Ratio 10 9 - 20   Sodium 137 134 - 144 mmol/L   Potassium 4.0 3.5 - 5.2 mmol/L   Chloride 97 96 - 106 mmol/L   CO2 22 20 - 29 mmol/L   Calcium 10.1 8.7 - 10.2 mg/dL  PSA     Status: None   Collection Time: 12/16/16  2:20 PM  Result Value Ref Range   Prostate Specific Ag, Serum 0.5 0.0 - 4.0 ng/mL    Comment: Roche ECLIA methodology. According to the American U[employer] Association, Serum PSA should decrease and remain at undetectable levels after radical prostatectomy. The AUA defines biochemical recurrence as an initial PSA value 0.2 ng/mL or greater followed by a subsequent confirmatory PSA value 0.2 ng/mL or greater. Values obtained with different assay methods or kits cannot be used interchangeably. Results cannot be interpreted as absolute evidence of the presence or absence of malignant disease.   POCT glycosylated hemoglobin (Hb A1C)     Status: None   Collection Time: 12/24/16  3:23 PM  Result Value Ref Range   Hemoglobin A1C 6.3    Blood pressure medicine and blood pressure levels reviewed today with patient. Compliant with blood pressure medicine. States does not miss a dose. No obvious side effects. Blood pressure generally good when checked elsewhere. Watching salt intake.   Patient notes he has a problem with high cholesterol. Has been attempting to work on it more Prior blood work results are reviewed with patient. Patient continues to work on fat intake in diet   Review of Systems No headache, no major weight loss or weight gain, no chest pain no back pain abdominal pain no change in bowel habits complete ROS otherwise negative     Objective:   Physical Exam  Alert and oriented, vitals reviewed and stable, NAD ENT-TM's and ext canals WNL bilat  via otoscopic exam Soft palate, tonsils and post pharynx WNL via oropharyngeal exam Neck-symmetric, no masses; thyroid nonpalpable and nontender Pulmonary-no tachypnea or accessory muscle use; Clear without wheezes via auscultation Card--no abnrml murmurs, rhythm reg and rate WNL Carotid pulses symmetric, without bruits       Assessment & Plan:  Impression 1 type 2 diabetes good control discussed A1c excellent to maintain same meds  #2 hypertension good control compliance discussed. Potential side effects discussed patient maintains  #3 hyperlipidemia. Patient has been resistant to lipid meds. Very long discussion held. Now he agrees that he needs to take a statin. We will initiate Lipitor rationale discussed  Greater than 50% of this 25 minute face to face visit was spent in counseling and discussion and coordination of care regarding the above diagnosis/diagnosies

## 2017-05-17 ENCOUNTER — Encounter: Payer: Self-pay | Admitting: Family Medicine

## 2017-05-17 LAB — HM DIABETES EYE EXAM

## 2017-06-16 ENCOUNTER — Other Ambulatory Visit: Payer: Self-pay | Admitting: Family Medicine

## 2017-06-22 ENCOUNTER — Encounter: Payer: Self-pay | Admitting: Family Medicine

## 2017-06-22 ENCOUNTER — Ambulatory Visit: Payer: BLUE CROSS/BLUE SHIELD | Admitting: Family Medicine

## 2017-06-22 VITALS — BP 146/92 | Ht 70.0 in | Wt 223.0 lb

## 2017-06-22 DIAGNOSIS — Z13220 Encounter for screening for lipoid disorders: Secondary | ICD-10-CM | POA: Diagnosis not present

## 2017-06-22 DIAGNOSIS — E119 Type 2 diabetes mellitus without complications: Secondary | ICD-10-CM | POA: Diagnosis not present

## 2017-06-22 DIAGNOSIS — Z1211 Encounter for screening for malignant neoplasm of colon: Secondary | ICD-10-CM | POA: Diagnosis not present

## 2017-06-22 DIAGNOSIS — E785 Hyperlipidemia, unspecified: Secondary | ICD-10-CM

## 2017-06-22 DIAGNOSIS — Z79899 Other long term (current) drug therapy: Secondary | ICD-10-CM | POA: Diagnosis not present

## 2017-06-22 LAB — POCT GLYCOSYLATED HEMOGLOBIN (HGB A1C): Hemoglobin A1C: 6.5

## 2017-06-22 MED ORDER — GLIPIZIDE 5 MG PO TABS: 5.0000 mg | ORAL_TABLET | Freq: Every day | ORAL | 5 refills | Status: DC

## 2017-06-22 MED ORDER — ATORVASTATIN CALCIUM 40 MG PO TABS: ORAL_TABLET | ORAL | 5 refills | Status: DC

## 2017-06-22 MED ORDER — METFORMIN HCL 500 MG PO TABS: ORAL_TABLET | ORAL | 1 refills | Status: DC

## 2017-06-22 MED ORDER — ENALAPRIL MALEATE 20 MG PO TABS: 20.0000 mg | ORAL_TABLET | Freq: Every day | ORAL | 5 refills | Status: DC

## 2017-06-22 NOTE — Progress Notes (Signed)
   Subjective:    Patient ID: Andrew Duarte, male    DOB: 12/07/1965, 52 y.o.   MRN: [id number]  HPI Patient is here today for a follow up on Dm. Patient takes Metformin 500 mg two tabs BID and Glipizide 5 mg one daily. Eats healthy and exercises 2-3 times per week. Sees Dr. Jorja Loa for eyes, no podiatrist.No other concerns today.   Patient claims compliance with diabetes medication. No obvious side effects. Reports no substantial low sugar spells. Most numbers are generally in good range when checked fasting. Generally does not miss a dose of medication. Watching diabetic diet closely   Blood pressure medicine and blood pressure levels reviewed today with patient. Compliant with blood pressure medicine. States does not miss a dose. No obvious side effects. Blood pressure generally good when checked elsewhere. Watching salt intake.   Patient continues to take lipid medication regularly. No obvious side effects from it. Generally does not miss a dose. Prior blood work results are reviewed with patient. Patient continues to work on fat intake in diet  Reflux worse with the spicey food at night  Not much reflux down thru the years   alka seltzer prn  On occasion   exerise three to four d per k      Review of Systems     Results for orders placed or performed in visit on 06/22/17  POCT glycosylated hemoglobin (Hb A1C)  Result Value Ref Range   Hemoglobin A1C 6.5     Objective:   Physical Exam Alert and oriented, vitals reviewed and stable, NAD ENT-TM's and ext canals WNL bilat via otoscopic exam Soft palate, tonsils and post pharynx WNL via oropharyngeal exam Neck-symmetric, no masses; thyroid nonpalpable and nontender Pulmonary-no tachypnea or accessory muscle use; Clear without wheezes via auscultation Card--no abnrml murmurs, rhythm reg and rate WNL Carotid pulses symmetric, without bruits        Assessment & Plan:  Impression1 type 2 diabetes.  Discussed.  Symptom  care discussed maintain same approach diet exercise discussed  2.  Hypertension currently control discussed maintain same meds  3.  Hyperlipidemia current blood work pending discussed  4.  Reflux intermittent not due to statins discussed  Further recommendations based on blood work

## 2017-06-22 NOTE — Progress Notes (Signed)
   Subjective:    Patient ID: Andrew Duarte, male    DOB: [birth year], 52 y.o.   MRN: [id number]  HPI    Review of Systems     Objective:   Physical Exam        Assessment & Plan:

## 2017-06-24 ENCOUNTER — Encounter (INDEPENDENT_AMBULATORY_CARE_PROVIDER_SITE_OTHER): Payer: Self-pay | Admitting: *Deleted

## 2017-06-24 ENCOUNTER — Encounter: Payer: Self-pay | Admitting: Family Medicine

## 2017-08-26 DIAGNOSIS — Z713 Dietary counseling and surveillance: Secondary | ICD-10-CM | POA: Diagnosis not present

## 2017-11-14 ENCOUNTER — Other Ambulatory Visit: Payer: Self-pay | Admitting: Family Medicine

## 2017-11-30 LAB — HM DIABETES EYE EXAM

## 2017-12-20 DIAGNOSIS — Z13220 Encounter for screening for lipoid disorders: Secondary | ICD-10-CM | POA: Diagnosis not present

## 2017-12-20 DIAGNOSIS — E119 Type 2 diabetes mellitus without complications: Secondary | ICD-10-CM | POA: Diagnosis not present

## 2017-12-20 DIAGNOSIS — Z79899 Other long term (current) drug therapy: Secondary | ICD-10-CM | POA: Diagnosis not present

## 2017-12-21 LAB — LIPID PANEL
Chol/HDL Ratio: 3.7 ratio (ref 0.0–5.0)
Cholesterol, Total: 130 mg/dL (ref 100–199)
HDL: 35 mg/dL — ABNORMAL LOW (ref >39)
LDL Calculated: 70 mg/dL (ref 0–99)
Triglycerides: 126 mg/dL (ref 0–149)
VLDL Cholesterol Cal: 25 mg/dL (ref 5–40)

## 2017-12-21 LAB — HEPATIC FUNCTION PANEL
ALT: 36 IU/L (ref 0–44)
AST: 31 IU/L (ref 0–40)
Albumin: 5 g/dL (ref 3.5–5.5)
Alkaline Phosphatase: 56 IU/L (ref 39–117)
Bilirubin Total: 0.4 mg/dL (ref 0.0–1.2)
Bilirubin, Direct: 0.11 mg/dL (ref 0.00–0.40)
Total Protein: 7.9 g/dL (ref 6.0–8.5)

## 2017-12-21 LAB — MICROALBUMIN / CREATININE URINE RATIO
Creatinine, Urine: 271.4 mg/dL
Microalb/Creat Ratio: 633.8 mg/g creat — ABNORMAL HIGH (ref 0.0–30.0)
Microalbumin, Urine: 1720 ug/mL

## 2017-12-24 ENCOUNTER — Other Ambulatory Visit: Payer: Self-pay | Admitting: Family Medicine

## 2017-12-28 ENCOUNTER — Encounter: Payer: Self-pay | Admitting: Family Medicine

## 2017-12-28 ENCOUNTER — Ambulatory Visit: Payer: BLUE CROSS/BLUE SHIELD | Admitting: Family Medicine

## 2017-12-28 VITALS — BP 152/86 | Ht 70.0 in | Wt 217.0 lb

## 2017-12-28 DIAGNOSIS — E1121 Type 2 diabetes mellitus with diabetic nephropathy: Secondary | ICD-10-CM | POA: Diagnosis not present

## 2017-12-28 DIAGNOSIS — Z1211 Encounter for screening for malignant neoplasm of colon: Secondary | ICD-10-CM

## 2017-12-28 DIAGNOSIS — Z0000 Encounter for general adult medical examination without abnormal findings: Secondary | ICD-10-CM

## 2017-12-28 DIAGNOSIS — I10 Essential (primary) hypertension: Secondary | ICD-10-CM | POA: Diagnosis not present

## 2017-12-28 DIAGNOSIS — E785 Hyperlipidemia, unspecified: Secondary | ICD-10-CM

## 2017-12-28 DIAGNOSIS — Z125 Encounter for screening for malignant neoplasm of prostate: Secondary | ICD-10-CM

## 2017-12-28 DIAGNOSIS — E119 Type 2 diabetes mellitus without complications: Secondary | ICD-10-CM

## 2017-12-28 LAB — POCT GLYCOSYLATED HEMOGLOBIN (HGB A1C): Hemoglobin A1C: 6.7 % — AB (ref 4.0–5.6)

## 2017-12-28 MED ORDER — GLIPIZIDE 5 MG PO TABS: 5.0000 mg | ORAL_TABLET | Freq: Every day | ORAL | 1 refills | Status: DC

## 2017-12-28 MED ORDER — ATORVASTATIN CALCIUM 40 MG PO TABS: 40.0000 mg | ORAL_TABLET | Freq: Every day | ORAL | 1 refills | Status: DC

## 2017-12-28 MED ORDER — METFORMIN HCL 500 MG PO TABS: ORAL_TABLET | ORAL | 1 refills | Status: DC

## 2017-12-28 MED ORDER — ENALAPRIL MALEATE 20 MG PO TABS: 20.0000 mg | ORAL_TABLET | Freq: Every day | ORAL | 1 refills | Status: DC

## 2017-12-28 NOTE — Progress Notes (Signed)
Subjective:    Patient ID: Andrew Duarte, male    DOB: [birth year], 52 y.o.   MRN: [id number]  HPI The patient comes in today for a wellness visit.    A review of their health history was completed.  A review of medications was also completed.  Any needed refills; none  Eating habits: health conscious  Falls/  MVA accidents in past few months: none  Regular exercise: lift weights, walk, runs  Specialist pt sees on regular basis: none  Preventative health issues were discussed.   Additional concerns: none  Results for orders placed or performed in visit on 12/28/17  POCT glycosylated hemoglobin (Hb A1C)  Result Value Ref Range   Hemoglobin A1C 6.7 (A) 4.0 - 5.6 %   HbA1c POC (<> result, manual entry)     HbA1c, POC (prediabetic range)     HbA1c, POC (controlled diabetic range)     Declines flu vaccine.   Patient claims compliance with diabetes medication. No obvious side effects. Reports no substantial low sugar spells. Most numbers are generally in good range when checked fasting. Generally does not miss a dose of medication. Watching diabetic diet closely  Blood pressure medicine and blood pressure levels reviewed today with patient. Compliant with blood pressure medicine. States does not miss a dose. No obvious side effects. Blood pressure generally good when checked elsewhere. Watching salt intake.   Patient continues to take lipid medication regularly. No obvious side effects from it. Generally does not miss a dose. Prior blood work results are reviewed with patient. Patient continues to work on fat intake in diet  Reflux overall a lot better  Not a flu shot person   Review of Systems  Constitutional: Negative for activity change, appetite change and fever.  HENT: Negative for congestion and rhinorrhea.   Eyes: Negative for discharge.  Respiratory: Negative for cough and wheezing.   Cardiovascular: Negative for chest pain.  Gastrointestinal: Negative for  abdominal pain, blood in stool and vomiting.  Genitourinary: Negative for difficulty urinating and frequency.  Musculoskeletal: Negative for neck pain.  Skin: Negative for rash.  Allergic/Immunologic: Negative for environmental allergies and food allergies.  Neurological: Negative for weakness and headaches.  Psychiatric/Behavioral: Negative for agitation.  All other systems reviewed and are negative.      Objective:   Physical Exam  Constitutional: He appears well-developed and well-nourished.  HENT:  Head: Normocephalic and atraumatic.  Right Ear: External ear normal.  Left Ear: External ear normal.  Nose: Nose normal.  Mouth/Throat: Oropharynx is clear and moist.  Eyes: Pupils are equal, round, and reactive to light. EOM are normal.  Neck: Normal range of motion. Neck supple. No thyromegaly present.  Cardiovascular: Normal rate, regular rhythm and normal heart sounds.  No murmur heard. Pulmonary/Chest: Effort normal and breath sounds normal. No respiratory distress. He has no wheezes.  Abdominal: Soft. Bowel sounds are normal. He exhibits no distension and no mass. There is no tenderness.  Genitourinary: Penis normal.  Musculoskeletal: Normal range of motion. He exhibits no edema.  Lymphadenopathy:    He has no cervical adenopathy.  Neurological: He is alert. He exhibits normal muscle tone.  Skin: Skin is warm and dry. No erythema.  Psychiatric: He has a normal mood and affect. His behavior is normal. Judgment normal.  Vitals reviewed.         Assessment & Plan:  Impression proteinuria-mo on dialysis, pt concerned re this, met 7 pendong, await these results  2.  Wellness exam.  Diet discussed exercise discussed.  Vaccines discussed.  Patient to work on colonoscopy.  Appropriate blood work  3.  Type 2 diabetes.  Control good A1c good compliance reviewed  4.  Hypertension good control discussed maintain same meds  Medications refilled diet exercise discussed  appropriate blood work GI referral

## 2017-12-29 LAB — BASIC METABOLIC PANEL
BUN/Creatinine Ratio: 11 (ref 9–20)
BUN: 14 mg/dL (ref 6–24)
CO2: 22 mmol/L (ref 20–29)
Calcium: 10.1 mg/dL (ref 8.7–10.2)
Chloride: 104 mmol/L (ref 96–106)
Creatinine, Ser: 1.24 mg/dL (ref 0.76–1.27)
GFR calc Af Amer: 76 mL/min/{1.73_m2} (ref >59)
GFR calc non Af Amer: 66 mL/min/{1.73_m2} (ref >59)
Glucose: 84 mg/dL (ref 65–99)
Potassium: 4.3 mmol/L (ref 3.5–5.2)
Sodium: 144 mmol/L (ref 134–144)

## 2017-12-29 LAB — PSA: Prostate Specific Ag, Serum: 0.5 ng/mL (ref 0.0–4.0)

## 2017-12-30 ENCOUNTER — Encounter (INDEPENDENT_AMBULATORY_CARE_PROVIDER_SITE_OTHER): Payer: Self-pay | Admitting: *Deleted

## 2017-12-30 ENCOUNTER — Encounter: Payer: Self-pay | Admitting: Family Medicine

## 2018-01-02 ENCOUNTER — Encounter: Payer: Self-pay | Admitting: Family Medicine

## 2018-01-20 ENCOUNTER — Other Ambulatory Visit (INDEPENDENT_AMBULATORY_CARE_PROVIDER_SITE_OTHER): Payer: Self-pay | Admitting: *Deleted

## 2018-01-20 DIAGNOSIS — Z1211 Encounter for screening for malignant neoplasm of colon: Secondary | ICD-10-CM | POA: Insufficient documentation

## 2018-04-11 ENCOUNTER — Encounter (INDEPENDENT_AMBULATORY_CARE_PROVIDER_SITE_OTHER): Payer: Self-pay | Admitting: *Deleted

## 2018-04-11 ENCOUNTER — Telehealth (INDEPENDENT_AMBULATORY_CARE_PROVIDER_SITE_OTHER): Payer: Self-pay | Admitting: *Deleted

## 2018-04-11 NOTE — Telephone Encounter (Signed)
Patient needs trilyte 

## 2018-04-12 MED ORDER — PEG 3350-KCL-NA BICARB-NACL 420 G PO SOLR: 4000.0000 mL | Freq: Once | ORAL | 0 refills | Status: AC

## 2018-05-03 ENCOUNTER — Telehealth (INDEPENDENT_AMBULATORY_CARE_PROVIDER_SITE_OTHER): Payer: Self-pay | Admitting: *Deleted

## 2018-05-03 NOTE — Telephone Encounter (Signed)
Referring MD/PCP: steve luking   Procedure: tcs  Reason/Indication:  screening  Has patient had this procedure before?  no  If so, when, by whom and where?    Is there a family history of colon cancer?  no  Who?  What age when diagnosed?    Is patient diabetic?   yes      Does patient have prosthetic heart valve or mechanical valve?  no  Do you have a pacemaker?  no  Has patient ever had endocarditis? no  Has patient had joint replacement within last 12 months?  no  Is patient constipated or do they take laxatives? no  Does patient have a history of alcohol/drug use?  no  Is patient on blood thinner such as Coumadin, Plavix and/or Aspirin? no  Medications: metformin 500 mg 2 tab bid, glipizide 5 mg daily, enalapril 20 mg daily, atorvastatin 40 mg daily  Allergies: nkda  Medication Adjustment per Dr Lindi Adie, NP: hold metformin & glipizide evening before and morning of  Procedure date & time: 06/01/18 at 830

## 2018-05-03 NOTE — Telephone Encounter (Signed)
agree

## 2018-06-01 ENCOUNTER — Other Ambulatory Visit: Payer: Self-pay

## 2018-06-01 ENCOUNTER — Ambulatory Visit (HOSPITAL_COMMUNITY)
Admission: RE | Admit: 2018-06-01 | Discharge: 2018-06-01 | Disposition: A | Discharge status: Home / Self Care | Payer: BLUE CROSS/BLUE SHIELD | Attending: Internal Medicine | Admitting: Internal Medicine

## 2018-06-01 ENCOUNTER — Encounter (HOSPITAL_COMMUNITY)
Admission: RE | Disposition: A | Discharge status: Home / Self Care | Payer: Self-pay | Source: Home / Self Care | Attending: Internal Medicine

## 2018-06-01 ENCOUNTER — Encounter (HOSPITAL_COMMUNITY): Payer: Self-pay | Admitting: *Deleted

## 2018-06-01 DIAGNOSIS — D123 Benign neoplasm of transverse colon: Secondary | ICD-10-CM | POA: Diagnosis not present

## 2018-06-01 DIAGNOSIS — D122 Benign neoplasm of ascending colon: Secondary | ICD-10-CM | POA: Insufficient documentation

## 2018-06-01 DIAGNOSIS — Z79899 Other long term (current) drug therapy: Secondary | ICD-10-CM | POA: Insufficient documentation

## 2018-06-01 DIAGNOSIS — Z7984 Long term (current) use of oral hypoglycemic drugs: Secondary | ICD-10-CM | POA: Insufficient documentation

## 2018-06-01 DIAGNOSIS — E7800 Pure hypercholesterolemia, unspecified: Secondary | ICD-10-CM | POA: Insufficient documentation

## 2018-06-01 DIAGNOSIS — I10 Essential (primary) hypertension: Secondary | ICD-10-CM | POA: Insufficient documentation

## 2018-06-01 DIAGNOSIS — Z7982 Long term (current) use of aspirin: Secondary | ICD-10-CM | POA: Diagnosis not present

## 2018-06-01 DIAGNOSIS — E119 Type 2 diabetes mellitus without complications: Secondary | ICD-10-CM | POA: Diagnosis not present

## 2018-06-01 DIAGNOSIS — Z1211 Encounter for screening for malignant neoplasm of colon: Secondary | ICD-10-CM | POA: Diagnosis not present

## 2018-06-01 HISTORY — PX: COLONOSCOPY: SHX5424

## 2018-06-01 HISTORY — DX: Type 2 diabetes mellitus without complications: E11.9

## 2018-06-01 HISTORY — DX: Pure hypercholesterolemia, unspecified: E78.00

## 2018-06-01 HISTORY — PX: POLYPECTOMY: SHX5525

## 2018-06-01 HISTORY — DX: Essential (primary) hypertension: I10

## 2018-06-01 LAB — GLUCOSE, CAPILLARY
Glucose-Capillary: 206 mg/dL — ABNORMAL HIGH (ref 70–99)
Glucose-Capillary: 161 mg/dL — ABNORMAL HIGH (ref 70–99)

## 2018-06-01 SURGERY — COLONOSCOPY
Anesthesia: Moderate Sedation

## 2018-06-01 MED ORDER — MIDAZOLAM HCL 5 MG/5ML IJ SOLN
INTRAMUSCULAR | Status: DC | PRN
  Administered 2018-06-01 (×2): 2 mg via INTRAVENOUS

## 2018-06-01 MED ORDER — MEPERIDINE HCL 100 MG/ML IJ SOLN
INTRAMUSCULAR | Status: AC
  Filled 2018-06-01: qty 1

## 2018-06-01 MED ORDER — STERILE WATER FOR IRRIGATION IR SOLN
Status: DC | PRN
  Administered 2018-06-01: 08:00:00

## 2018-06-01 MED ORDER — MIDAZOLAM HCL 5 MG/5ML IJ SOLN
INTRAMUSCULAR | Status: AC
  Filled 2018-06-01: qty 10

## 2018-06-01 MED ORDER — SODIUM CHLORIDE 0.9 % IV SOLN
INTRAVENOUS | Status: DC
  Administered 2018-06-01: 08:00:00 via INTRAVENOUS

## 2018-06-01 MED ORDER — MEPERIDINE HCL 50 MG/ML IJ SOLN
INTRAMUSCULAR | Status: DC | PRN
  Administered 2018-06-01 (×2): 25 mg via INTRAVENOUS

## 2018-06-01 NOTE — Discharge Instructions (Signed)
No aspirin or NSAIDs for 24 hours. Resume usual medications and diet as before. No driving for 24 hours. Physician will call with biopsy results.  Colon Polyps  Polyps are tissue growths inside the body. Polyps can grow in many places, including the large intestine (colon). A polyp may be a round bump or a mushroom-shaped growth. You could have one polyp or several. Most colon polyps are noncancerous (benign). However, some colon polyps can become cancerous over time. Finding and removing the polyps early can help prevent this. What are the causes? The exact cause of colon polyps is not known. What increases the risk? You are more likely to develop this condition if you:  Have a family history of colon cancer or colon polyps.  Are older than 79 or older than 45 if you are African American.  Have inflammatory bowel disease, such as ulcerative colitis or Crohn's disease.  Have certain hereditary conditions, such as: ? Familial adenomatous polyposis. ? Lynch syndrome. ? Turcot syndrome. ? Peutz-Jeghers syndrome.  Are overweight.  Smoke cigarettes.  Do not get enough exercise.  Drink too much alcohol.  Eat a diet that is high in fat and red meat and low in fiber.  Had childhood cancer that was treated with abdominal radiation. What are the signs or symptoms? Most polyps do not cause symptoms. If you have symptoms, they may include:  Blood coming from your rectum when having a bowel movement.  Blood in your stool. The stool may look dark red or black.  Abdominal pain.  A change in bowel habits, such as constipation or diarrhea. How is this diagnosed? This condition is diagnosed with a colonoscopy. This is a procedure in which a lighted, flexible scope is inserted into the anus and then passed into the colon to examine the area. Polyps are sometimes found when a colonoscopy is done as part of routine cancer screening tests. How is this treated? Treatment for this  condition involves removing any polyps that are found. Most polyps can be removed during a colonoscopy. Those polyps will then be tested for cancer. Additional treatment may be needed depending on the results of testing. Follow these instructions at home: Lifestyle  Maintain a healthy weight, or lose weight if recommended by your health care provider.  Exercise every day or as told by your health care provider.  Do not use any products that contain nicotine or tobacco, such as cigarettes and e-cigarettes. If you need help quitting, ask your health care provider.  If you drink alcohol, limit how much you have: ? 0-1 drink a day for women. ? 0-2 drinks a day for men.  Be aware of how much alcohol is in your drink. In the [place] one drink equals one 12 oz bottle of beer (355 mL), one 5 oz glass of wine (148 mL), or one 1 oz shot of hard liquor (44 mL). Eating and drinking   Eat foods that are high in fiber, such as fruits, vegetables, and whole grains.  Eat foods that are high in calcium and vitamin D, such as milk, cheese, yogurt, eggs, liver, fish, and broccoli.  Limit foods that are high in fat, such as fried foods and desserts.  Limit the amount of red meat and processed meat you eat, such as hot dogs, sausage, bacon, and lunch meats. General instructions  Keep all follow-up visits as told by your health care provider. This is important. ? This includes having regularly scheduled colonoscopies. ? Talk to your health care  provider about when you need a colonoscopy. Contact a health care provider if:  You have new or worsening bleeding during a bowel movement.  You have new or increased blood in your stool.  You have a change in bowel habits.  You lose weight for no known reason. Summary  Polyps are tissue growths inside the body. Polyps can grow in many places, including the colon.  Most colon polyps are noncancerous (benign), but some can become cancerous over  time.  This condition is diagnosed with a colonoscopy.  Treatment for this condition involves removing any polyps that are found. Most polyps can be removed during a colonoscopy. This information is not intended to replace advice given to you by your health care provider. Make sure you discuss any questions you have with your health care provider. Document Released: 12/18/2003 Document Revised: 07/08/2017 Document Reviewed: 07/08/2017 Elsevier Interactive Patient Education  2019 [employer].   Colonoscopy, Adult, Care After This sheet gives you information about how to care for yourself after your procedure. Your health care provider may also give you more specific instructions. If you have problems or questions, contact your health care provider. What can I expect after the procedure? After the procedure, it is common to have:  A small amount of blood in your stool for 24 hours after the procedure.  Some gas.  Mild abdominal cramping or bloating. Follow these instructions at home: General instructions  For the first 24 hours after the procedure: ? Do not drive or use machinery. ? Do not sign important documents. ? Do not drink alcohol. ? Do your regular daily activities at a slower pace than normal. ? Eat soft, easy-to-digest foods.  Take over-the-counter or prescription medicines only as told by your health care provider. Relieving cramping and bloating   Try walking around when you have cramps or feel bloated.  Apply heat to your abdomen as told by your health care provider. Use a heat source that your health care provider recommends, such as a moist heat pack or a heating pad. ? Place a towel between your skin and the heat source. ? Leave the heat on for 20-30 minutes. ? Remove the heat if your skin turns bright red. This is especially important if you are unable to feel pain, heat, or cold. You may have a greater risk of getting burned. Eating and drinking   Drink  enough fluid to keep your urine pale yellow.  Resume your normal diet as instructed by your health care provider. Avoid heavy or fried foods that are hard to digest.  Avoid drinking alcohol for as long as instructed by your health care provider. Contact a health care provider if:  You have blood in your stool 2-3 days after the procedure. Get help right away if:  You have more than a small spotting of blood in your stool.  You pass large blood clots in your stool.  Your abdomen is swollen.  You have nausea or vomiting.  You have a fever.  You have increasing abdominal pain that is not relieved with medicine. Summary  After the procedure, it is common to have a small amount of blood in your stool. You may also have mild abdominal cramping and bloating.  For the first 24 hours after the procedure, do not drive or use machinery, sign important documents, or drink alcohol.  Contact your health care provider if you have a lot of blood in your stool, nausea or vomiting, a fever, or increased abdominal  pain. This information is not intended to replace advice given to you by your health care provider. Make sure you discuss any questions you have with your health care provider. Document Released: 11/05/2003 Document Revised: 01/13/2017 Document Reviewed: 06/04/2015 Elsevier Interactive Patient Education  2019 [employer].

## 2018-06-01 NOTE — H&P (Signed)
Andrew Duarte is an 53 y.o. male.   Chief Complaint: Patient is here for colonoscopy. HPI: Patient is a 53 year old Afro-American male who is here for screening colonoscopy.  This is patient's first exam.  He denies abdominal pain change in bowel habits or rectal bleeding. Family history is negative for CRC.  Past Medical History:  Diagnosis Date  . Diabetes mellitus without complication ([place])   . Hypercholesteremia   . Hypertension     Past Surgical History:  Procedure Laterality Date  . NO PAST SURGERIES      Family History  Problem Relation Age of Onset  . Colon cancer Neg Hx    Social History:  reports that he has never smoked. He has never used smokeless tobacco. He reports previous alcohol use. He reports previous drug use.  Allergies:  Allergies  Allergen Reactions  . Bee Venom Anaphylaxis    Medications Prior to Admission  Medication Sig Dispense Refill  . Aspirin-Salicylamide-Caffeine (BC HEADACHE POWDER PO) Take 1 Package by mouth daily as needed (headache).    Marland Kitchen atorvastatin (LIPITOR) 40 MG tablet Take 1 tablet (40 mg total) by mouth at bedtime. 90 tablet 1  . enalapril (VASOTEC) 20 MG tablet Take 1 tablet (20 mg total) by mouth daily. (Patient taking differently: Take 20 mg by mouth daily with supper. ) 90 tablet 1  . glipiZIDE (GLUCOTROL) 5 MG tablet Take 1 tablet (5 mg total) by mouth daily before breakfast. 90 tablet 1  . metFORMIN (GLUCOPHAGE) 500 MG tablet TAKE 2 TABLETS BY MOUTH TWICE A DAY (Patient taking differently: Take 1,000 mg by mouth 2 (two) times daily with a meal. ) 360 tablet 1  . Blood Glucose Monitoring Suppl (BLOOD GLUCOSE MONITOR SYSTEM) w/Device KIT Dispense based on patient's preference or insurance. Test blood sugar once a day. Dispense lancets and strips.Dx Diabetes # E11.9 1 each 0    Results for orders placed or performed during the hospital encounter of 06/01/18 (from the past 48 hour(s))  Glucose, capillary     Status: Abnormal   Collection Time: 06/01/18  7:45 AM  Result Value Ref Range   Glucose-Capillary 206 (H) 70 - 99 mg/dL   No results found.  ROS  Blood pressure 120/74, pulse 89, temperature 98.9 F (37.2 C), temperature source Oral, resp. rate 18, height _0  (1.803 m), weight 98.4 kg, SpO2 98 %. Physical Exam  Constitutional: He appears well-developed and well-nourished.  HENT:  Mouth/Throat: Oropharynx is clear and moist.  Eyes: Conjunctivae are normal. No scleral icterus.  Neck: No thyromegaly present.  Cardiovascular: Normal rate, regular rhythm and normal heart sounds.  No murmur heard. Respiratory: Effort normal and breath sounds normal.  GI: Soft. He exhibits no distension and no mass. There is no abdominal tenderness.  Musculoskeletal:        General: No edema.  Lymphadenopathy:    He has no cervical adenopathy.  Neurological: He is alert.  Skin: Skin is warm.     Assessment/Plan Average rescreening colonoscopy.  Hildred Laser, MD 06/01/2018, 8:23 AM

## 2018-06-01 NOTE — Op Note (Signed)
[facility] Patient Name: Andrew Duarte Procedure Date: 06/01/2018 8:19 AM MRN: [id number] Date of Birth: 1965/05/12 Attending MD: Hildred Laser , MD CSN: [id number] Age: 53 Admit Type: Outpatient Procedure:                Colonoscopy Indications:              Screening for colorectal malignant neoplasm Providers:                Hildred Laser, MD, Otis Peak B. Sharon Seller, RN, Nelma Rothman, Technician Referring MD:             Grace Bushy. Wolfgang Phoenix, MD Medicines:                Meperidine 50 mg IV, Midazolam 4 mg IV Complications:            No immediate complications. Estimated Blood Loss:     Estimated blood loss was minimal. Procedure:                Pre-Anesthesia Assessment:                           - Prior to the procedure, a History and Physical                            was performed, and patient medications and                            allergies were reviewed. The patient's tolerance of                            previous anesthesia was also reviewed. The risks                            and benefits of the procedure and the sedation                            options and risks were discussed with the patient.                            All questions were answered, and informed consent                            was obtained. Prior Anticoagulants: The patient has                            taken no previous anticoagulant or antiplatelet                            agents. ASA Grade Assessment: II - A patient with                            mild systemic disease. After reviewing the risks  and benefits, the patient was deemed in                            satisfactory condition to undergo the procedure.                           After obtaining informed consent, the colonoscope                            was passed under direct vision. Throughout the                            procedure, the patient's blood pressure, pulse, and                             oxygen saturations were monitored continuously. The                            [id number]-[id number] ([id number]) scope was introduced through                            the anus and advanced to the the cecum, identified                            by appendiceal orifice and ileocecal valve. The                            colonoscopy was performed without difficulty. The                            patient tolerated the procedure well. The quality                            of the bowel preparation was adequate to identify                            polyps. The ileocecal valve, appendiceal orifice,                            and rectum were photographed. Scope In: 8:30:40 AM Scope Out: 8:57:01 AM Scope Withdrawal Time: 0 hours 19 minutes 11 seconds  Total Procedure Duration: 0 hours 26 minutes 21 seconds  Findings:      The perianal and digital rectal examinations were normal.      A small polyp was found in the proximal ascending colon. The polyp was       sessile. Biopsies were taken with a cold forceps for histology. The       pathology specimen was placed into Bottle Number 1.      A 4 mm polyp was found in the proximal transverse colon. The polyp was       sessile. The polyp was removed with a cold snare. Resection and       retrieval were complete. The pathology specimen was placed into Bottle       Number 1.      A  6 mm polyp was found in the splenic flexure. The polyp was sessile.       The polyp was removed with a cold snare. Resection and retrieval were       complete. The pathology specimen was placed into Bottle Number 1.      The exam was otherwise normal throughout the examined colon.      The retroflexed view of the distal rectum and anal verge was normal and       showed no anal or rectal abnormalities. Impression:               - One small polyp in the proximal ascending colon.                            Biopsied.                           - One 4 mm polyp  in the proximal transverse colon,                            removed with a cold snare. Resected and retrieved.                           - One 6 mm polyp at the splenic flexure, removed                            with a cold snare. Resected and retrieved. Moderate Sedation:      Moderate (conscious) sedation was administered by the endoscopy nurse       and supervised by the endoscopist. The following parameters were       monitored: oxygen saturation, heart rate, blood pressure, CO2       capnography and response to care. Total physician intraservice time was       31 minutes. Recommendation:           - Patient has a contact number available for                            emergencies. The signs and symptoms of potential                            delayed complications were discussed with the                            patient. Return to normal activities tomorrow.                            Written discharge instructions were provided to the                            patient.                           - Resume previous diet today.                           - Continue present medications.                           -  No aspirin, ibuprofen, naproxen, or other                            non-steroidal anti-inflammatory drugs for 1 day.                           - Await pathology results.                           - Repeat colonoscopy is recommended. The                            colonoscopy date will be determined after pathology                            results from today's exam become available for                            review. Procedure Code(s):        --- Professional ---                           [phone], Colonoscopy, flexible; with removal of                            tumor(s), polyp(s), or other lesion(s) by snare                            technique                           45380, 59, Colonoscopy, flexible; with biopsy,                            single or multiple                            99153, Moderate sedation; each additional 15                            minutes intraservice time                           G0500, Moderate sedation services provided by the                            same physician or other qualified health care                            professional performing a gastrointestinal                            endoscopic service that sedation supports,                            requiring the presence of an independent trained  observer to assist in the monitoring of the                            patient's level of consciousness and physiological                            status; initial 15 minutes of intra-service time;                            patient age 42 years or older (additional time may                            be reported with [phone], as appropriate) Diagnosis Code(s):        --- Professional ---                           Z12.11, Encounter for screening for malignant                            neoplasm of colon                           D12.2, Benign neoplasm of ascending colon                           D12.3, Benign neoplasm of transverse colon (hepatic                            flexure or splenic flexure) CPT copyright 2018 American Medical Association. All rights reserved. The codes documented in this report are preliminary and upon coder review may  be revised to meet current compliance requirements. Hildred Laser, MD Hildred Laser, MD 06/01/2018 9:06:06 AM This report has been signed electronically. Number of Addenda: 0

## 2018-06-06 ENCOUNTER — Encounter (HOSPITAL_COMMUNITY): Payer: Self-pay | Admitting: Internal Medicine

## 2018-06-28 ENCOUNTER — Other Ambulatory Visit: Payer: Self-pay | Admitting: Family Medicine

## 2018-06-28 ENCOUNTER — Ambulatory Visit: Payer: BLUE CROSS/BLUE SHIELD | Admitting: Family Medicine

## 2018-08-08 ENCOUNTER — Other Ambulatory Visit: Payer: Self-pay | Admitting: Family Medicine

## 2018-08-09 NOTE — Telephone Encounter (Signed)
Call and sched appt ithin next ten d, then ref times one

## 2018-08-10 NOTE — Telephone Encounter (Signed)
Left message to return call 

## 2018-08-11 NOTE — Telephone Encounter (Signed)
Pt returned call and has appt set for med check on May 18th.

## 2018-08-22 ENCOUNTER — Other Ambulatory Visit: Payer: Self-pay

## 2018-08-22 ENCOUNTER — Ambulatory Visit (INDEPENDENT_AMBULATORY_CARE_PROVIDER_SITE_OTHER): Payer: BLUE CROSS/BLUE SHIELD | Admitting: Family Medicine

## 2018-08-22 ENCOUNTER — Encounter: Payer: Self-pay | Admitting: Family Medicine

## 2018-08-22 DIAGNOSIS — M25522 Pain in left elbow: Secondary | ICD-10-CM | POA: Diagnosis not present

## 2018-08-22 DIAGNOSIS — I10 Essential (primary) hypertension: Secondary | ICD-10-CM

## 2018-08-22 DIAGNOSIS — E119 Type 2 diabetes mellitus without complications: Secondary | ICD-10-CM | POA: Diagnosis not present

## 2018-08-22 DIAGNOSIS — E785 Hyperlipidemia, unspecified: Secondary | ICD-10-CM | POA: Diagnosis not present

## 2018-08-22 MED ORDER — ETODOLAC 400 MG PO TABS: ORAL_TABLET | ORAL | 0 refills | Status: DC

## 2018-08-22 MED ORDER — METFORMIN HCL 500 MG PO TABS: 1000.0000 mg | ORAL_TABLET | Freq: Two times a day (BID) | ORAL | 1 refills | Status: DC

## 2018-08-22 MED ORDER — ATORVASTATIN CALCIUM 40 MG PO TABS: ORAL_TABLET | ORAL | 1 refills | Status: DC

## 2018-08-22 MED ORDER — ENALAPRIL MALEATE 20 MG PO TABS: 20.0000 mg | ORAL_TABLET | Freq: Every day | ORAL | 1 refills | Status: DC

## 2018-08-22 MED ORDER — GLIPIZIDE 5 MG PO TABS: ORAL_TABLET | ORAL | 1 refills | Status: DC

## 2018-08-22 NOTE — Progress Notes (Signed)
   Subjective:    Patient ID: Andrew Duarte, male    DOB: May 20, 1965, 53 y.o.   MRN: [id number]  Diabetes  He presents for his follow-up diabetic visit. He has type 2 diabetes mellitus. Current diabetic treatments: metformin, glipizide. He is compliant with treatment all of the time. Home blood sugar record trend: average 123 in the mornings. Eye exam is current (less than one year ago).   Left elbow pain and locking up for 3 weeks. Wearing elbow sleeve and it helps.  Has taken minimal over-the-counter medications  Virtual Visit via Video Note  I connected with Andrew Duarte on 08/22/18 at 10:00 AM EDT by a video enabled telemedicine application and verified that I am speaking with the correct person using two identifiers.  Location: Patient: home Provider: office   I discussed the limitations of evaluation and management by telemedicine and the availability of in person appointments. The patient expressed understanding and agreed to proceed.  History of Present Illness:    Observations/Objective:   Assessment and Plan:   Follow Up Instructions:    I discussed the assessment and treatment plan with the patient. The patient was provided an opportunity to ask questions and all were answered. The patient agreed with the plan and demonstrated an understanding of the instructions.   The patient was advised to call back or seek an in-person evaluation if the symptoms worsen or if the condition fails to improve as anticipated.  I provided 25 minutes of non-face-to-face time during this encounter.   Blood pressure medicine and blood pressure levels reviewed today with patient. Compliant with blood pressure medicine. States does not miss a dose. No obvious side effects. Blood pressure generally good when checked elsewhere. Watching salt intake.   Patient continues to take lipid medication regularly. No obvious side effects from it. Generally does not miss a dose. Prior blood work  results are reviewed with patient. Patient continues to work on fat intake in diet  diaphoresis Patient claims compliance with diabetes medication. No obvious side effects. Reports no substantial low sugar spells. Most numbers are generally in good range when checked fasting. Generally does not miss a dose of medication. Watching diabetic diet closely    Review of Systems No headache, no major weight loss or weight gain, no chest pain no back pain abdominal pain no change in bowel habits complete ROS otherwise negative     Objective:   Physical Exam  Virtual visit      Assessment & Plan:  Impression 1 type 2 diabetes.  Control discussed to maintain same approach and medications.  2.  Hypertension.  Blood pressure good on last visit compliant with meds patient maintain  3.  Hyperlipidemia discussed hold off on blood work at this time rationale discussed  4.  Lateral epicondylitis.  Discussed medications prescribed.  Local measures discussed  Follow-up in 6 months for wellness plus chronic

## 2019-02-21 ENCOUNTER — Ambulatory Visit (INDEPENDENT_AMBULATORY_CARE_PROVIDER_SITE_OTHER): Payer: BC Managed Care – PPO | Admitting: Family Medicine

## 2019-02-21 ENCOUNTER — Encounter: Payer: Self-pay | Admitting: Family Medicine

## 2019-02-21 ENCOUNTER — Other Ambulatory Visit: Payer: Self-pay

## 2019-02-21 VITALS — BP 140/90 | Temp 97.8°F | Ht 70.0 in | Wt 226.6 lb

## 2019-02-21 DIAGNOSIS — Z0000 Encounter for general adult medical examination without abnormal findings: Secondary | ICD-10-CM

## 2019-02-21 DIAGNOSIS — I10 Essential (primary) hypertension: Secondary | ICD-10-CM | POA: Diagnosis not present

## 2019-02-21 DIAGNOSIS — Z79899 Other long term (current) drug therapy: Secondary | ICD-10-CM | POA: Diagnosis not present

## 2019-02-21 DIAGNOSIS — E785 Hyperlipidemia, unspecified: Secondary | ICD-10-CM | POA: Diagnosis not present

## 2019-02-21 DIAGNOSIS — Z125 Encounter for screening for malignant neoplasm of prostate: Secondary | ICD-10-CM | POA: Diagnosis not present

## 2019-02-21 DIAGNOSIS — E119 Type 2 diabetes mellitus without complications: Secondary | ICD-10-CM | POA: Diagnosis not present

## 2019-02-21 LAB — POCT GLYCOSYLATED HEMOGLOBIN (HGB A1C): Hemoglobin A1C: 6.3 % — AB (ref 4.0–5.6)

## 2019-02-21 MED ORDER — ATORVASTATIN CALCIUM 40 MG PO TABS: ORAL_TABLET | ORAL | 1 refills | Status: DC

## 2019-02-21 MED ORDER — ENALAPRIL MALEATE 20 MG PO TABS: 20.0000 mg | ORAL_TABLET | Freq: Every day | ORAL | 1 refills | Status: DC

## 2019-02-21 MED ORDER — GLIPIZIDE 5 MG PO TABS: ORAL_TABLET | ORAL | 1 refills | Status: DC

## 2019-02-21 MED ORDER — METFORMIN HCL 500 MG PO TABS: 1000.0000 mg | ORAL_TABLET | Freq: Two times a day (BID) | ORAL | 1 refills | Status: DC

## 2019-02-21 NOTE — Progress Notes (Signed)
Subjective:    Patient ID: Andrew Duarte, male    DOB: 1965-07-30, 53 y.o.   MRN: [id number]  Diabetes He presents for his follow-up diabetic visit. He has type 2 diabetes mellitus. There are no hypoglycemic associated symptoms. Pertinent negatives for hypoglycemia include no headaches. There are no diabetic associated symptoms. Pertinent negatives for diabetes include no chest pain and no weakness. There are no hypoglycemic complications. There are no diabetic complications. Risk factors for coronary artery disease include hypertension. Current diabetic treatments: glipizide and Metformin  He is compliant with treatment all of the time.   The patient comes in today for a wellness visit.    A review of their health history was completed.  A review of medications was also completed.  Any needed refills; no needed refills  Eating habits: healthy  Falls/  MVA accidents in past few months: none  Regular exercise: daily   Specialist pt sees on regular basis: none  Preventative health issues were discussed.   Additional concerns:  Results for orders placed or performed in visit on 02/21/19  POCT HgB A1C  Result Value Ref Range   Hemoglobin A1C 6.3 (A) 4.0 - 5.6 %   HbA1c POC (<> result, manual entry)     HbA1c, POC (prediabetic range)     HbA1c, POC (controlled diabetic range)     Had some low spells with metoformin  Patient claims compliance with diabetes medication. No obvious side effects. Reports no substantial low sugar spells. Most numbers are generally in good range when checked fasting. Generally does not miss a dose of medication. Watching diabetic diet closely  Blood pressure medicine and blood pressure levels reviewed today with patient. Compliant with blood pressure medicine. States does not miss a dose. No obvious side effects. Blood pressure generally good when checked elsewhere. Watching salt intake.   Colon done   Review of Systems  Constitutional:  Negative for activity change, appetite change and fever.  HENT: Negative for congestion and rhinorrhea.   Eyes: Negative for discharge.  Respiratory: Negative for cough and wheezing.   Cardiovascular: Negative for chest pain.  Gastrointestinal: Negative for abdominal pain, blood in stool and vomiting.  Genitourinary: Negative for difficulty urinating and frequency.  Musculoskeletal: Negative for neck pain.  Skin: Negative for rash.  Allergic/Immunologic: Negative for environmental allergies and food allergies.  Neurological: Negative for weakness and headaches.  Psychiatric/Behavioral: Negative for agitation.       Objective:   Physical Exam Vitals signs reviewed.  Constitutional:      Appearance: He is well-developed.  HENT:     Head: Normocephalic and atraumatic.     Right Ear: External ear normal.     Left Ear: External ear normal.     Nose: Nose normal.  Eyes:     Pupils: Pupils are equal, round, and reactive to light.  Neck:     Musculoskeletal: Normal range of motion and neck supple.     Thyroid: No thyromegaly.  Cardiovascular:     Rate and Rhythm: Normal rate and regular rhythm.     Heart sounds: Normal heart sounds. No murmur.  Pulmonary:     Effort: Pulmonary effort is normal. No respiratory distress.     Breath sounds: Normal breath sounds. No wheezing.  Abdominal:     General: Bowel sounds are normal. There is no distension.     Palpations: Abdomen is soft. There is no mass.     Tenderness: There is no abdominal tenderness.  Genitourinary:  Penis: Normal.   Musculoskeletal: Normal range of motion.  Lymphadenopathy:     Cervical: No cervical adenopathy.  Skin:    General: Skin is warm and dry.     Findings: No erythema.  Neurological:     Mental Status: He is alert.     Motor: No abnormal muscle tone.  Psychiatric:        Behavior: Behavior normal.        Judgment: Judgment normal.    Prostate within normal limits       Assessment & Plan:   Impression well adult exam.  Diet discussed.  Exercise discussed.  Appropriate blood work.  Flu shot declined colon done,  Went well.next colon due in 4.5 yeas  2.  Type 2 diabetes.  A1c excellent discussed to maintain same approach diet discussed  3.  Essential hypertension.  Good control discussed compliance stressed medications maintain  4.  Hyperlipidemia status uncertain  Follow-up in 6 months further recommendations based on blood work

## 2019-03-09 ENCOUNTER — Telehealth: Payer: Self-pay | Admitting: Family Medicine

## 2019-03-09 ENCOUNTER — Other Ambulatory Visit: Payer: Self-pay | Admitting: *Deleted

## 2019-03-09 NOTE — Telephone Encounter (Signed)
I called [facility] and held for 18 mins before the pharm picked up and was told that they dont know what kind of strips he uses because they have never filled them there. I told pharm that the [facility] [place] [facility] strips and pharm said not they have not. They need to know what type of strips he uses before they can send a request. I called and left a message with the pt.

## 2019-03-09 NOTE — Telephone Encounter (Signed)
[facility] only [facility] in [place] and he dont know why they said that. I said I dont either. He said he uses contour next glucose strips and I faxed over rx [facility] strips to [facility]. Pt was notified.

## 2019-03-09 NOTE — Telephone Encounter (Signed)
Pt contacted pharmacy last week requesting refill on test strips and the pharmacy told him today we have not replied to request. I told pt I dont see where they sent over a request but we would get the request to the doctor.   [id number] - [place], [place]

## 2019-03-17 DIAGNOSIS — Z79899 Other long term (current) drug therapy: Secondary | ICD-10-CM | POA: Diagnosis not present

## 2019-03-17 DIAGNOSIS — E119 Type 2 diabetes mellitus without complications: Secondary | ICD-10-CM | POA: Diagnosis not present

## 2019-03-17 DIAGNOSIS — I10 Essential (primary) hypertension: Secondary | ICD-10-CM | POA: Diagnosis not present

## 2019-03-17 DIAGNOSIS — E785 Hyperlipidemia, unspecified: Secondary | ICD-10-CM | POA: Diagnosis not present

## 2019-03-17 DIAGNOSIS — Z125 Encounter for screening for malignant neoplasm of prostate: Secondary | ICD-10-CM | POA: Diagnosis not present

## 2019-03-18 LAB — HEPATIC FUNCTION PANEL
ALT: 24 IU/L (ref 0–44)
AST: 31 IU/L (ref 0–40)
Albumin: 4.9 g/dL (ref 3.8–4.9)
Alkaline Phosphatase: 60 IU/L (ref 39–117)
Bilirubin Total: 0.4 mg/dL (ref 0.0–1.2)
Bilirubin, Direct: 0.13 mg/dL (ref 0.00–0.40)
Total Protein: 7.6 g/dL (ref 6.0–8.5)

## 2019-03-18 LAB — BASIC METABOLIC PANEL
BUN/Creatinine Ratio: 9 (ref 9–20)
BUN: 12 mg/dL (ref 6–24)
CO2: 23 mmol/L (ref 20–29)
Calcium: 10 mg/dL (ref 8.7–10.2)
Chloride: 106 mmol/L (ref 96–106)
Creatinine, Ser: 1.39 mg/dL — ABNORMAL HIGH (ref 0.76–1.27)
GFR calc Af Amer: 66 mL/min/{1.73_m2} (ref >59)
GFR calc non Af Amer: 57 mL/min/{1.73_m2} — ABNORMAL LOW (ref >59)
Glucose: 65 mg/dL (ref 65–99)
Potassium: 4.2 mmol/L (ref 3.5–5.2)
Sodium: 142 mmol/L (ref 134–144)

## 2019-03-18 LAB — PSA: Prostate Specific Ag, Serum: 0.5 ng/mL (ref 0.0–4.0)

## 2019-03-18 LAB — MICROALBUMIN / CREATININE URINE RATIO
Creatinine, Urine: 240.1 mg/dL
Microalb/Creat Ratio: 701 mg/g creat — ABNORMAL HIGH (ref 0–29)
Microalbumin, Urine: 1682.8 ug/mL

## 2019-03-18 LAB — LIPID PANEL
Chol/HDL Ratio: 3 ratio (ref 0.0–5.0)
Cholesterol, Total: 116 mg/dL (ref 100–199)
HDL: 39 mg/dL — ABNORMAL LOW (ref >39)
LDL Chol Calc (NIH): 62 mg/dL (ref 0–99)
Triglycerides: 74 mg/dL (ref 0–149)
VLDL Cholesterol Cal: 15 mg/dL (ref 5–40)

## 2019-03-29 ENCOUNTER — Other Ambulatory Visit: Payer: Self-pay | Admitting: *Deleted

## 2019-03-29 DIAGNOSIS — R809 Proteinuria, unspecified: Secondary | ICD-10-CM

## 2019-03-29 NOTE — Progress Notes (Signed)
error 

## 2019-04-04 ENCOUNTER — Encounter: Payer: Self-pay | Admitting: Family Medicine

## 2019-05-10 ENCOUNTER — Encounter: Payer: Self-pay | Admitting: Family Medicine

## 2019-06-09 ENCOUNTER — Other Ambulatory Visit: Payer: Self-pay | Admitting: Nephrology

## 2019-06-09 DIAGNOSIS — N189 Chronic kidney disease, unspecified: Secondary | ICD-10-CM | POA: Diagnosis not present

## 2019-06-09 DIAGNOSIS — I129 Hypertensive chronic kidney disease with stage 1 through stage 4 chronic kidney disease, or unspecified chronic kidney disease: Secondary | ICD-10-CM | POA: Diagnosis not present

## 2019-06-09 DIAGNOSIS — E1122 Type 2 diabetes mellitus with diabetic chronic kidney disease: Secondary | ICD-10-CM

## 2019-06-09 DIAGNOSIS — Z79899 Other long term (current) drug therapy: Secondary | ICD-10-CM | POA: Diagnosis not present

## 2019-06-09 DIAGNOSIS — R809 Proteinuria, unspecified: Secondary | ICD-10-CM | POA: Diagnosis not present

## 2019-06-09 DIAGNOSIS — N186 End stage renal disease: Secondary | ICD-10-CM

## 2019-06-19 DIAGNOSIS — N189 Chronic kidney disease, unspecified: Secondary | ICD-10-CM | POA: Diagnosis not present

## 2019-06-19 DIAGNOSIS — E1129 Type 2 diabetes mellitus with other diabetic kidney complication: Secondary | ICD-10-CM | POA: Diagnosis not present

## 2019-06-19 DIAGNOSIS — E1122 Type 2 diabetes mellitus with diabetic chronic kidney disease: Secondary | ICD-10-CM | POA: Diagnosis not present

## 2019-06-19 DIAGNOSIS — Z1159 Encounter for screening for other viral diseases: Secondary | ICD-10-CM | POA: Diagnosis not present

## 2019-06-19 DIAGNOSIS — Z79899 Other long term (current) drug therapy: Secondary | ICD-10-CM | POA: Diagnosis not present

## 2019-06-19 DIAGNOSIS — R809 Proteinuria, unspecified: Secondary | ICD-10-CM | POA: Diagnosis not present

## 2019-06-19 DIAGNOSIS — I129 Hypertensive chronic kidney disease with stage 1 through stage 4 chronic kidney disease, or unspecified chronic kidney disease: Secondary | ICD-10-CM | POA: Diagnosis not present

## 2019-06-23 ENCOUNTER — Ambulatory Visit (HOSPITAL_COMMUNITY)
Admission: RE | Admit: 2019-06-23 | Discharge: 2019-06-23 | Disposition: A | Discharge status: Home / Self Care | Payer: BC Managed Care – PPO | Source: Ambulatory Visit | Attending: Nephrology | Admitting: Nephrology

## 2019-06-23 ENCOUNTER — Other Ambulatory Visit: Payer: Self-pay

## 2019-06-23 DIAGNOSIS — N189 Chronic kidney disease, unspecified: Secondary | ICD-10-CM | POA: Diagnosis not present

## 2019-06-23 DIAGNOSIS — N186 End stage renal disease: Secondary | ICD-10-CM | POA: Diagnosis not present

## 2019-06-23 DIAGNOSIS — E1122 Type 2 diabetes mellitus with diabetic chronic kidney disease: Secondary | ICD-10-CM | POA: Diagnosis not present

## 2019-06-23 DIAGNOSIS — N182 Chronic kidney disease, stage 2 (mild): Secondary | ICD-10-CM | POA: Diagnosis not present

## 2019-07-14 DIAGNOSIS — E559 Vitamin D deficiency, unspecified: Secondary | ICD-10-CM | POA: Diagnosis not present

## 2019-07-14 DIAGNOSIS — I129 Hypertensive chronic kidney disease with stage 1 through stage 4 chronic kidney disease, or unspecified chronic kidney disease: Secondary | ICD-10-CM | POA: Diagnosis not present

## 2019-07-14 DIAGNOSIS — R809 Proteinuria, unspecified: Secondary | ICD-10-CM | POA: Diagnosis not present

## 2019-07-14 DIAGNOSIS — N189 Chronic kidney disease, unspecified: Secondary | ICD-10-CM | POA: Diagnosis not present

## 2019-08-07 DIAGNOSIS — Z03818 Encounter for observation for suspected exposure to other biological agents ruled out: Secondary | ICD-10-CM | POA: Diagnosis not present

## 2019-08-07 DIAGNOSIS — Z20828 Contact with and (suspected) exposure to other viral communicable diseases: Secondary | ICD-10-CM | POA: Diagnosis not present

## 2019-08-09 DIAGNOSIS — E1165 Type 2 diabetes mellitus with hyperglycemia: Secondary | ICD-10-CM | POA: Diagnosis not present

## 2019-08-09 DIAGNOSIS — R61 Generalized hyperhidrosis: Secondary | ICD-10-CM | POA: Diagnosis not present

## 2019-08-09 DIAGNOSIS — R5381 Other malaise: Secondary | ICD-10-CM | POA: Diagnosis not present

## 2019-08-10 ENCOUNTER — Ambulatory Visit
Admission: EM | Admit: 2019-08-10 | Discharge: 2019-08-10 | Disposition: A | Discharge status: Home / Self Care | Payer: BC Managed Care – PPO | Attending: Emergency Medicine | Admitting: Emergency Medicine

## 2019-08-10 ENCOUNTER — Other Ambulatory Visit: Payer: Self-pay

## 2019-08-10 DIAGNOSIS — R509 Fever, unspecified: Secondary | ICD-10-CM

## 2019-08-10 DIAGNOSIS — Z20822 Contact with and (suspected) exposure to covid-19: Secondary | ICD-10-CM | POA: Diagnosis not present

## 2019-08-10 MED ORDER — IBUPROFEN 800 MG PO TABS
800.0000 mg | ORAL_TABLET | Freq: Once | ORAL | Status: AC
  Administered 2019-08-10: 15:00:00 800 mg via ORAL

## 2019-08-10 NOTE — ED Provider Notes (Signed)
[facility]   [id number] 08/10/19 Arrival Time: 5885   CC: COVID symptoms  SUBJECTIVE: History from: patient.  Andrew Duarte is a 54 y.o. male who presents with fever of 102.2 in office, denies other symptoms at this time.  COVID exposure 3 days ago.   Denies alleviating or aggravating factors.  Denies previous COVID infection in the past.   Denies chills, fatigue, sinus pain, rhinorrhea, sore throat, SOB, wheezing, chest pain, nausea, changes in bowel or bladder habits.    ROS: As per HPI.  All other pertinent ROS negative.     Past Medical History:  Diagnosis Date  . Diabetes mellitus without complication ([place])   . Hypercholesteremia   . Hypertension    Past Surgical History:  Procedure Laterality Date  . COLONOSCOPY N/A 06/01/2018   Procedure: COLONOSCOPY;  Surgeon: Rogene Houston, MD;  Location: [facility];  Service: Endoscopy;  Laterality: N/A;  830  . NO PAST SURGERIES    . POLYPECTOMY  06/01/2018   Procedure: POLYPECTOMY;  Surgeon: Rogene Houston, MD;  Location: [facility];  Service: Endoscopy;;  colon   Allergies  Allergen Reactions  . Bee Venom Anaphylaxis   No current facility-administered medications on file prior to encounter.   Current Outpatient Medications on File Prior to Encounter  Medication Sig Dispense Refill  . Aspirin-Salicylamide-Caffeine (BC HEADACHE POWDER PO) Take 1 Package by mouth daily as needed (headache).    Marland Kitchen atorvastatin (LIPITOR) 40 MG tablet TAKE 1 TABLET BY MOUTH EVERYDAY AT BEDTIME 90 tablet 1  . Blood Glucose Monitoring Suppl (BLOOD GLUCOSE MONITOR SYSTEM) w/Device KIT Dispense based on patient's preference or insurance. Test blood sugar once a day. Dispense lancets and strips.Dx Diabetes # E11.9 1 each 0  . enalapril (VASOTEC) 20 MG tablet Take 1 tablet (20 mg total) by mouth daily with supper. 90 tablet 1  . glipiZIDE (GLUCOTROL) 5 MG tablet TAKE 1 TABLET BY MOUTH DAILY BEFORE BREAKFAST 90 tablet 1  . metFORMIN  (GLUCOPHAGE) 500 MG tablet Take 2 tablets (1,000 mg total) by mouth 2 (two) times daily. 360 tablet 1   Social History   Socioeconomic History  . Marital status: Single    Spouse name: Not on file  . Number of children: Not on file  . Years of education: Not on file  . Highest education level: Not on file  Occupational History  . Not on file  Tobacco Use  . Smoking status: Never Smoker  . Smokeless tobacco: Never Used  Substance and Sexual Activity  . Alcohol use: Not Currently    Alcohol/week: 0.0 standard drinks  . Drug use: Not Currently  . Sexual activity: Not on file  Other Topics Concern  . Not on file  Social History Narrative  . Not on file   Social Determinants of Health   Financial Resource Strain:   . Difficulty of Paying Living Expenses:   Food Insecurity:   . Worried About Charity fundraiser in the Last Year:   . Arboriculturist in the Last Year:   Transportation Needs:   . Film/video editor (Medical):   Marland Kitchen Lack of Transportation (Non-Medical):   Physical Activity:   . Days of Exercise per Week:   . Minutes of Exercise per Session:   Stress:   . Feeling of Stress :   Social Connections:   . Frequency of Communication with Friends and Family:   . Frequency of Social Gatherings with Friends and Family:   .  Attends Religious Services:   . Active Member of Clubs or Organizations:   . Attends Archivist Meetings:   Marland Kitchen Marital Status:   Intimate Partner Violence:   . Fear of Current or Ex-Partner:   . Emotionally Abused:   Marland Kitchen Physically Abused:   . Sexually Abused:    Family History  Problem Relation Age of Onset  . Colon cancer Neg Hx     OBJECTIVE:  Vitals:   08/10/19 1429  BP: 131/84  Pulse: (!) 112  Resp: 17  Temp: (!) 102.2 F (39 C)  TempSrc: Oral  SpO2: 97%     General appearance: alert; appears fatigued, but nontoxic; speaking in full sentences and tolerating own secretions HEENT: NCAT; Ears: EACs clear, TMs pearly  gray; Eyes: PERRL.  EOM grossly intact.  Nose: nares patent without rhinorrhea, Throat: oropharynx clear, tonsils non erythematous or enlarged, uvula midline  Neck: supple without LAD Lungs: unlabored respirations, symmetrical air entry; cough: absent; no respiratory distress; CTAB Heart: regular rate and rhythm.   Skin: warm and dry Psychological: alert and cooperative; normal mood and affect  ASSESSMENT & PLAN:  1. Suspected COVID-19 virus infection   2. Fever, unspecified    COVID testing ordered.  It will take between 2-5 days for test results.  Someone will contact you regarding abnormal results.    In the meantime: You should remain isolated in your home for 10 days from symptom onset AND greater than 72 hours after symptoms resolution (absence of fever without the use of fever-reducing medication and improvement in respiratory symptoms), whichever is longer Get plenty of rest and push fluids Use OTC zyrtec for nasal congestion, runny nose, and/or sore throat Use OTC flonase for nasal congestion and runny nose Use medications daily for symptom relief Use OTC medications like ibuprofen or tylenol as needed fever or pain Call or go to the ED if you have any new or worsening symptoms such as fever, cough, shortness of breath, chest tightness, chest pain, turning blue, changes in mental status, etc...   Reviewed expectations re: course of current medical issues. Questions answered. Outlined signs and symptoms indicating need for more acute intervention. Patient verbalized understanding. After Visit Summary given.         Lestine Box, PA-C 08/10/19 1454

## 2019-08-10 NOTE — Discharge Instructions (Signed)
COVID testing ordered.  It will take between 2-5 days for test results.  Someone will contact you regarding abnormal results.    In the meantime: You should remain isolated in your home for 10 days from symptom onset AND greater than 72 hours after symptoms resolution (absence of fever without the use of fever-reducing medication and improvement in respiratory symptoms), whichever is longer Get plenty of rest and push fluids Use OTC zyrtec for nasal congestion, runny nose, and/or sore throat Use OTC flonase for nasal congestion and runny nose Use medications daily for symptom relief Use OTC medications like ibuprofen or tylenol as needed fever or pain Call or go to the ED if you have any new or worsening symptoms such as fever, cough, shortness of breath, chest tightness, chest pain, turning blue, changes in mental status, etc...  

## 2019-08-10 NOTE — ED Triage Notes (Signed)
Pt had covid exposure 3 days ago denies symptoms but has fever

## 2019-08-11 LAB — NOVEL CORONAVIRUS, NAA: SARS-CoV-2, NAA: DETECTED — AB

## 2019-08-11 LAB — SARS-COV-2, NAA 2 DAY TAT

## 2019-08-12 ENCOUNTER — Other Ambulatory Visit: Payer: Self-pay | Admitting: Unknown Physician Specialty

## 2019-08-12 ENCOUNTER — Telehealth: Payer: Self-pay | Admitting: Unknown Physician Specialty

## 2019-08-12 DIAGNOSIS — U071 COVID-19: Secondary | ICD-10-CM

## 2019-08-12 DIAGNOSIS — E1121 Type 2 diabetes mellitus with diabetic nephropathy: Secondary | ICD-10-CM

## 2019-08-12 DIAGNOSIS — I10 Essential (primary) hypertension: Secondary | ICD-10-CM

## 2019-08-12 DIAGNOSIS — E119 Type 2 diabetes mellitus without complications: Secondary | ICD-10-CM

## 2019-08-12 NOTE — Telephone Encounter (Signed)
  I connected by phone with Andrew Duarte on 08/12/2019 at 8:23 AM to discuss the potential use of an new treatment for mild to moderate COVID-19 viral infection in non-hospitalized patients.  This patient is a 54 y.o. male that meets the FDA criteria for Emergency Use Authorization of bamlanivimab/etesevimab or casirivimab/imdevimab.  Has a (+) direct SARS-CoV-2 viral test result  Has mild or moderate COVID-19   Is ? 56 years of age and weighs ? 40 kg  Is NOT hospitalized due to COVID-19  Is NOT requiring oxygen therapy or requiring an increase in baseline oxygen flow rate due to COVID-19  Is within 10 days of symptom onset  Has at least one of the high risk factor(s) for progression to severe COVID-19 and/or hospitalization as defined in EUA.  Specific high risk criteria : Diabetes and other comorbid conditions   I have spoken and communicated the following to the patient or parent/caregiver:  1. FDA has authorized the emergency use of bamlanivimab/etesevimab and casirivimab\imdevimab for the treatment of mild to moderate COVID-19 in adults and pediatric patients with positive results of direct SARS-CoV-2 viral testing who are 71 years of age and older weighing at least 40 kg, and who are at high risk for progressing to severe COVID-19 and/or hospitalization.  2. The significant known and potential risks and benefits of bamlanivimab/etesevimab and casirivimab\imdevimab, and the extent to which such potential risks and benefits are unknown.  3. Information on available alternative treatments and the risks and benefits of those alternatives, including clinical trials.  4. Patients treated with bamlanivimab/etesevimab and casirivimab\imdevimab should continue to self-isolate and use infection control measures (e.g., wear mask, isolate, social distance, avoid sharing personal items, clean and disinfect "high touch" surfaces, and frequent handwashing) according to CDC guidelines.   5. The  patient or parent/caregiver has the option to accept or refuse bamlanivimab/etesevimab or casirivimab\imdevimab .  After reviewing this information with the patient, The patient agreed to proceed with receiving the bamlanimivab infusion and will be provided a copy of the Fact sheet prior to receiving the infusion.[name] 08/12/2019 8:23 AM

## 2019-08-13 ENCOUNTER — Ambulatory Visit (HOSPITAL_COMMUNITY): Payer: BC Managed Care – PPO

## 2019-08-14 ENCOUNTER — Telehealth: Payer: Self-pay | Admitting: Unknown Physician Specialty

## 2019-08-14 MED ORDER — SODIUM CHLORIDE 0.9 % IV SOLN
Freq: Once | INTRAVENOUS | Status: AC
  Filled 2019-08-14: qty 700

## 2019-08-14 NOTE — Telephone Encounter (Signed)
Pt called back for infusion center directions.  Directions given

## 2019-08-15 ENCOUNTER — Ambulatory Visit (HOSPITAL_COMMUNITY)
Admission: RE | Admit: 2019-08-15 | Discharge: 2019-08-15 | Disposition: A | Discharge status: Home / Self Care | Payer: BC Managed Care – PPO | Source: Ambulatory Visit | Attending: Pulmonary Disease | Admitting: Pulmonary Disease

## 2019-08-15 DIAGNOSIS — E119 Type 2 diabetes mellitus without complications: Secondary | ICD-10-CM | POA: Insufficient documentation

## 2019-08-15 DIAGNOSIS — I10 Essential (primary) hypertension: Secondary | ICD-10-CM | POA: Diagnosis not present

## 2019-08-15 DIAGNOSIS — E1121 Type 2 diabetes mellitus with diabetic nephropathy: Secondary | ICD-10-CM

## 2019-08-15 DIAGNOSIS — U071 COVID-19: Secondary | ICD-10-CM

## 2019-08-15 MED ORDER — METHYLPREDNISOLONE SODIUM SUCC 125 MG IJ SOLR: 125.0000 mg | Freq: Once | INTRAMUSCULAR | Status: DC | PRN

## 2019-08-15 MED ORDER — FAMOTIDINE IN NACL 20-0.9 MG/50ML-% IV SOLN: 20.0000 mg | Freq: Once | INTRAVENOUS | Status: DC | PRN

## 2019-08-15 MED ORDER — EPINEPHRINE 0.3 MG/0.3ML IJ SOAJ: 0.3000 mg | Freq: Once | INTRAMUSCULAR | Status: DC | PRN

## 2019-08-15 MED ORDER — ALBUTEROL SULFATE HFA 108 (90 BASE) MCG/ACT IN AERS: 2.0000 | INHALATION_SPRAY | Freq: Once | RESPIRATORY_TRACT | Status: DC | PRN

## 2019-08-15 MED ORDER — DIPHENHYDRAMINE HCL 50 MG/ML IJ SOLN: 50.0000 mg | Freq: Once | INTRAMUSCULAR | Status: DC | PRN

## 2019-08-15 MED ORDER — SODIUM CHLORIDE 0.9 % IV SOLN: INTRAVENOUS | Status: DC | PRN

## 2019-08-15 NOTE — Progress Notes (Signed)
  Diagnosis: COVID-19  Physician: Asencion Noble  Procedure: [facility] Med: bamlanivimab\etesevimab infusion - Provided patient with bamlanimivab\etesevimab fact sheet for patients, parents and caregivers prior to infusion.  Complications: No immediate complications noted.  Discharge: Discharged home   [place], Why C 08/15/2019

## 2019-08-15 NOTE — Discharge Instructions (Signed)

## 2019-08-21 ENCOUNTER — Telehealth (INDEPENDENT_AMBULATORY_CARE_PROVIDER_SITE_OTHER): Payer: BC Managed Care – PPO | Admitting: Family Medicine

## 2019-08-21 ENCOUNTER — Other Ambulatory Visit: Payer: Self-pay

## 2019-08-21 ENCOUNTER — Encounter: Payer: Self-pay | Admitting: Family Medicine

## 2019-08-21 ENCOUNTER — Telehealth: Payer: Self-pay | Admitting: *Deleted

## 2019-08-21 DIAGNOSIS — Z79899 Other long term (current) drug therapy: Secondary | ICD-10-CM | POA: Diagnosis not present

## 2019-08-21 DIAGNOSIS — E785 Hyperlipidemia, unspecified: Secondary | ICD-10-CM

## 2019-08-21 DIAGNOSIS — E119 Type 2 diabetes mellitus without complications: Secondary | ICD-10-CM | POA: Diagnosis not present

## 2019-08-21 MED ORDER — ATORVASTATIN CALCIUM 40 MG PO TABS: ORAL_TABLET | ORAL | 1 refills | Status: DC

## 2019-08-21 MED ORDER — ENALAPRIL MALEATE 20 MG PO TABS: 20.0000 mg | ORAL_TABLET | Freq: Every day | ORAL | 1 refills | Status: DC

## 2019-08-21 MED ORDER — METFORMIN HCL 500 MG PO TABS: 1000.0000 mg | ORAL_TABLET | Freq: Two times a day (BID) | ORAL | 1 refills | Status: DC

## 2019-08-21 MED ORDER — GLIPIZIDE 5 MG PO TABS: ORAL_TABLET | ORAL | 1 refills | Status: DC

## 2019-08-21 NOTE — Telephone Encounter (Signed)
Mr. jasan, doughtie are scheduled for a virtual visit with your provider today.    Just as we do with appointments in the office, we must obtain your consent to participate.  Your consent will be active for this visit and any virtual visit you may have with one of our providers in the next 365 days.    If you have a MyChart account, I can also send a copy of this consent to you electronically.  All virtual visits are billed to your insurance company just like a traditional visit in the office.  As this is a virtual visit, video technology does not allow for your provider to perform a traditional examination.  This may limit your provider's ability to fully assess your condition.  If your provider identifies any concerns that need to be evaluated in person or the need to arrange testing such as labs, EKG, etc, we will make arrangements to do so.    Although advances in technology are sophisticated, we cannot ensure that it will always work on either your end or our end.  If the connection with a video visit is poor, we may have to switch to a telephone visit.  With either a video or telephone visit, we are not always able to ensure that we have a secure connection.   I need to obtain your verbal consent now.   Are you willing to proceed with your visit today?   CREWS MCCOLLAM has provided verbal consent on 08/21/2019 for a virtual visit (video or telephone).   Patsy Lager, LPN 06/04/6008  93:23 AM

## 2019-08-21 NOTE — Progress Notes (Signed)
   Subjective:    Patient ID: Andrew Duarte, male    DOB: March 28, 1966, 54 y.o.   MRN: [id number]  Diabetes He presents for his follow-up diabetic visit. He has type 2 diabetes mellitus. His disease course has been stable. There are no hypoglycemic associated symptoms. There are no diabetic associated symptoms. There are no hypoglycemic complications. There are no diabetic complications. He is following a generally healthy diet. He participates in exercise daily. He does not see a podiatrist.Eye exam is not current.  Hypertension This is a chronic problem. The current episode started more than 1 year ago. Condition status: Has not checked BP at home. Treatments tried: Enalapril. There are no compliance problems.   Hyperlipidemia This is a chronic problem. The current episode started more than 1 year ago. There are no compliance problems.       Review of Systems See below    Objective:   Physical Exam Virtual  Virtual Visit via Video Note  I connected with [name] on 08/21/19 at  1:10 PM EDT by a video enabled telemedicine application and verified that I am speaking with the correct person using two identifiers.  Location: Patient: home Provider: office   I discussed the limitations of evaluation and management by telemedicine and the availability of in person appointments. The patient expressed understanding and agreed to proceed.  History of Present Illness:    Observations/Objective:   Assessment and Plan:   Follow Up Instructions:    I discussed the assessment and treatment plan with the patient. The patient was provided an opportunity to ask questions and all were answered. The patient agreed with the plan and demonstrated an understanding of the instructions.   The patient was advised to call back or seek an in-person evaluation if the symptoms worsen or if the condition fails to improve as anticipated.  I provided 20 minutes of non-face-to-face time during  this encounter.  No headache no chest pain no shortness of breath  Physical  Virtual      Assessment & Plan:  Impression 1 hypertension.  Para good control discussed maintain same meds  2.  Hyperlipidemia.  Status uncertain appropriate blood work recommended  3.  Type 2 diabetes.  Patient reports sugars in good control.  Medications refilled same dose.  A1c as part of blood work  Follow-up in 6 months diet exercise discussed

## 2019-10-16 DIAGNOSIS — E1129 Type 2 diabetes mellitus with other diabetic kidney complication: Secondary | ICD-10-CM | POA: Diagnosis not present

## 2019-10-16 DIAGNOSIS — E1122 Type 2 diabetes mellitus with diabetic chronic kidney disease: Secondary | ICD-10-CM | POA: Diagnosis not present

## 2019-10-16 DIAGNOSIS — R809 Proteinuria, unspecified: Secondary | ICD-10-CM | POA: Diagnosis not present

## 2019-10-16 DIAGNOSIS — N189 Chronic kidney disease, unspecified: Secondary | ICD-10-CM | POA: Diagnosis not present

## 2019-10-27 DIAGNOSIS — E211 Secondary hyperparathyroidism, not elsewhere classified: Secondary | ICD-10-CM | POA: Diagnosis not present

## 2019-10-27 DIAGNOSIS — D638 Anemia in other chronic diseases classified elsewhere: Secondary | ICD-10-CM | POA: Diagnosis not present

## 2019-10-27 DIAGNOSIS — R809 Proteinuria, unspecified: Secondary | ICD-10-CM | POA: Diagnosis not present

## 2019-10-27 DIAGNOSIS — N189 Chronic kidney disease, unspecified: Secondary | ICD-10-CM | POA: Diagnosis not present

## 2020-01-26 DIAGNOSIS — E1129 Type 2 diabetes mellitus with other diabetic kidney complication: Secondary | ICD-10-CM | POA: Diagnosis not present

## 2020-01-26 DIAGNOSIS — N189 Chronic kidney disease, unspecified: Secondary | ICD-10-CM | POA: Diagnosis not present

## 2020-01-26 DIAGNOSIS — E1122 Type 2 diabetes mellitus with diabetic chronic kidney disease: Secondary | ICD-10-CM | POA: Diagnosis not present

## 2020-01-26 DIAGNOSIS — E211 Secondary hyperparathyroidism, not elsewhere classified: Secondary | ICD-10-CM | POA: Diagnosis not present

## 2020-02-02 DIAGNOSIS — N189 Chronic kidney disease, unspecified: Secondary | ICD-10-CM | POA: Diagnosis not present

## 2020-02-02 DIAGNOSIS — I129 Hypertensive chronic kidney disease with stage 1 through stage 4 chronic kidney disease, or unspecified chronic kidney disease: Secondary | ICD-10-CM | POA: Diagnosis not present

## 2020-02-02 DIAGNOSIS — D638 Anemia in other chronic diseases classified elsewhere: Secondary | ICD-10-CM | POA: Diagnosis not present

## 2020-02-02 DIAGNOSIS — R809 Proteinuria, unspecified: Secondary | ICD-10-CM | POA: Diagnosis not present

## 2020-04-29 ENCOUNTER — Other Ambulatory Visit: Payer: Self-pay | Admitting: *Deleted

## 2020-04-29 MED ORDER — GLIPIZIDE 5 MG PO TABS: ORAL_TABLET | ORAL | 0 refills | Status: DC

## 2020-05-01 ENCOUNTER — Encounter: Payer: Self-pay | Admitting: Family Medicine

## 2020-05-01 ENCOUNTER — Other Ambulatory Visit: Payer: Self-pay

## 2020-05-01 ENCOUNTER — Ambulatory Visit (INDEPENDENT_AMBULATORY_CARE_PROVIDER_SITE_OTHER): Payer: BC Managed Care – PPO | Admitting: Family Medicine

## 2020-05-01 VITALS — BP 164/94 | HR 112 | Temp 97.4°F | Wt 213.8 lb

## 2020-05-01 DIAGNOSIS — E119 Type 2 diabetes mellitus without complications: Secondary | ICD-10-CM

## 2020-05-01 DIAGNOSIS — I10 Essential (primary) hypertension: Secondary | ICD-10-CM | POA: Diagnosis not present

## 2020-05-01 DIAGNOSIS — Z0000 Encounter for general adult medical examination without abnormal findings: Secondary | ICD-10-CM

## 2020-05-01 DIAGNOSIS — Z125 Encounter for screening for malignant neoplasm of prostate: Secondary | ICD-10-CM

## 2020-05-01 DIAGNOSIS — E785 Hyperlipidemia, unspecified: Secondary | ICD-10-CM

## 2020-05-01 MED ORDER — METFORMIN HCL 500 MG PO TABS: 1000.0000 mg | ORAL_TABLET | Freq: Two times a day (BID) | ORAL | 0 refills | Status: DC

## 2020-05-01 MED ORDER — GLIPIZIDE 5 MG PO TABS: ORAL_TABLET | ORAL | 0 refills | Status: DC

## 2020-05-01 MED ORDER — ENALAPRIL MALEATE 20 MG PO TABS: 20.0000 mg | ORAL_TABLET | Freq: Every day | ORAL | 0 refills | Status: DC

## 2020-05-01 MED ORDER — ATORVASTATIN CALCIUM 40 MG PO TABS: ORAL_TABLET | ORAL | 0 refills | Status: DC

## 2020-05-01 NOTE — Progress Notes (Signed)
Patient ID: Andrew Duarte, male    DOB: [birth year], 55 y.o.   MRN: [id number]   Chief Complaint  Patient presents with  . Annual Exam    Patient ran out of Enalapril 1 week ago.    Subjective:    HPI The patient comes in today for a wellness visit.   A review of their health history was completed.  A review of medications was also completed.  Any needed refills; Enalapril  Eating habits: pretty good   Falls/  MVA accidents in past few months: none  Regular exercise: yes  Specialist pt sees on regular basis: Nephro  Preventative health issues were discussed.   Additional concerns: none  H/o dm2-  Checking bg in am 90s. Seeing Dr. Theador Duarte for nephrology. Had some protein in urine in past Nothing needing to change at this time.  Had covid in 5/21, and resolved well. Had the monoclonal ab plasma infusion and felt better.  dm2- no sores ulcers or burning on feet.  Eye exam- not been last yr. "my eye doctor".  Had colonoscopy, told to repeat in 5 yrs. Last one in 05/2018.  HTN Pt ran out of medications 1 wk ago. Pt was taking enalapril.  No SEs Denies chest pain, sob, LE swelling, or blurry vision.   HLD- doing well no new concerns.  Compliant with meds. No chest pain, palpitations, myalgias or joint pains.   Medical History Andrew Duarte has a past medical history of Diabetes mellitus without complication ([place]), Hypercholesteremia, and Hypertension.   Outpatient Encounter Medications as of 05/01/2020  Medication Sig  . atorvastatin (LIPITOR) 40 MG tablet TAKE 1 TABLET BY MOUTH EVERYDAY AT BEDTIME  . Blood Glucose Monitoring Suppl (BLOOD GLUCOSE MONITOR SYSTEM) w/Device KIT Dispense based on patient's preference or insurance. Test blood sugar once a day. Dispense lancets and strips.Dx Diabetes # E11.9  . enalapril (VASOTEC) 20 MG tablet Take 1 tablet (20 mg total) by mouth daily with supper.  Marland Kitchen glipiZIDE (GLUCOTROL) 5 MG tablet TAKE 1 TABLET BY MOUTH DAILY  BEFORE BREAKFAST  . metFORMIN (GLUCOPHAGE) 500 MG tablet Take 2 tablets (1,000 mg total) by mouth 2 (two) times daily.  . [DISCONTINUED] atorvastatin (LIPITOR) 40 MG tablet TAKE 1 TABLET BY MOUTH EVERYDAY AT BEDTIME  . [DISCONTINUED] enalapril (VASOTEC) 20 MG tablet Take 1 tablet (20 mg total) by mouth daily with supper.  . [DISCONTINUED] glipiZIDE (GLUCOTROL) 5 MG tablet TAKE 1 TABLET BY MOUTH DAILY BEFORE BREAKFAST  . [DISCONTINUED] metFORMIN (GLUCOPHAGE) 500 MG tablet Take 2 tablets (1,000 mg total) by mouth 2 (two) times daily.   No facility-administered encounter medications on file as of 05/01/2020.     Review of Systems  Constitutional: Negative for chills and fever.  HENT: Negative for congestion, rhinorrhea and sore throat.   Respiratory: Negative for cough, shortness of breath and wheezing.   Cardiovascular: Negative for chest pain and leg swelling.  Gastrointestinal: Negative for abdominal pain, diarrhea, nausea and vomiting.  Genitourinary: Negative for dysuria and frequency.  Skin: Negative for rash.  Neurological: Negative for dizziness, weakness and headaches.     Vitals BP (!) 164/94   Pulse (!) 112   Temp (!) 97.4 F (36.3 C)   Wt 213 lb 12.8 oz (97 kg)   SpO2 99%   BMI 30.68 kg/m   Objective:   Physical Exam Vitals and nursing note reviewed.  Constitutional:      General: He is not in acute distress.    Appearance: Normal appearance.  He is not ill-appearing.  HENT:     Head: Normocephalic.     Right Ear: Tympanic membrane, ear canal and external ear normal.     Left Ear: Tympanic membrane, ear canal and external ear normal.     Nose: Nose normal. No congestion or rhinorrhea.     Mouth/Throat:     Mouth: Mucous membranes are moist.     Pharynx: No oropharyngeal exudate or posterior oropharyngeal erythema.  Eyes:     Extraocular Movements: Extraocular movements intact.     Conjunctiva/sclera: Conjunctivae normal.     Pupils: Pupils are equal, round,  and reactive to light.  Cardiovascular:     Rate and Rhythm: Regular rhythm. Tachycardia present.     Pulses: Normal pulses.     Heart sounds: Normal heart sounds. No murmur heard.   Pulmonary:     Effort: Pulmonary effort is normal. No respiratory distress.     Breath sounds: Normal breath sounds. No wheezing, rhonchi or rales.  Abdominal:     General: Abdomen is flat. Bowel sounds are normal. There is no distension.     Palpations: Abdomen is soft. There is no mass.     Tenderness: There is no abdominal tenderness. There is no guarding or rebound.     Hernia: No hernia is present.  Musculoskeletal:        General: Normal range of motion.     Cervical back: Normal range of motion.     Right lower leg: No edema.     Left lower leg: No edema.  Skin:    General: Skin is warm and dry.     Findings: No rash.  Neurological:     General: No focal deficit present.     Mental Status: He is alert and oriented to person, place, and time.     Cranial Nerves: No cranial nerve deficit.     Motor: No weakness.     Gait: Gait normal.  Psychiatric:        Mood and Affect: Mood normal.        Behavior: Behavior normal.        Thought Content: Thought content normal.        Judgment: Judgment normal.      Assessment and Plan   1. Well adult exam  2. Essential hypertension - CBC - CMP14+EGFR - enalapril (VASOTEC) 20 MG tablet; Take 1 tablet (20 mg total) by mouth daily with supper.  Dispense: 90 tablet; Refill: 0  3. Hyperlipidemia LDL goal <100 - CBC - Lipid panel - atorvastatin (LIPITOR) 40 MG tablet; TAKE 1 TABLET BY MOUTH EVERYDAY AT BEDTIME  Dispense: 90 tablet; Refill: 0  4. Type 2 diabetes mellitus without complication, without long-term current use of insulin (HCC) - CMP14+EGFR - Hemoglobin A1c - Microalbumin, urine - metFORMIN (GLUCOPHAGE) 500 MG tablet; Take 2 tablets (1,000 mg total) by mouth 2 (two) times daily.  Dispense: 360 tablet; Refill: 0 - glipiZIDE  (GLUCOTROL) 5 MG tablet; TAKE 1 TABLET BY MOUTH DAILY BEFORE BREAKFAST  Dispense: 90 tablet; Refill: 0  5. Screening PSA (prostate specific antigen) - PSA   HM- pt needs to call for diabetic eye exam.  htn- uncontrolled, pt ran out of bp medications. Pt to pick it up and restart it.  Dm2- pt to get labs. Last a1c in 2020- was 6.3. cont metformin and glipizide.  HLD- pt to get labs.  Cont meds.  F/u [place] prn.

## 2020-05-06 DIAGNOSIS — E1122 Type 2 diabetes mellitus with diabetic chronic kidney disease: Secondary | ICD-10-CM | POA: Diagnosis not present

## 2020-05-06 DIAGNOSIS — E119 Type 2 diabetes mellitus without complications: Secondary | ICD-10-CM | POA: Diagnosis not present

## 2020-05-06 DIAGNOSIS — R809 Proteinuria, unspecified: Secondary | ICD-10-CM | POA: Diagnosis not present

## 2020-05-06 DIAGNOSIS — D638 Anemia in other chronic diseases classified elsewhere: Secondary | ICD-10-CM | POA: Diagnosis not present

## 2020-05-06 DIAGNOSIS — I10 Essential (primary) hypertension: Secondary | ICD-10-CM | POA: Diagnosis not present

## 2020-05-06 DIAGNOSIS — E785 Hyperlipidemia, unspecified: Secondary | ICD-10-CM | POA: Diagnosis not present

## 2020-05-06 DIAGNOSIS — Z125 Encounter for screening for malignant neoplasm of prostate: Secondary | ICD-10-CM | POA: Diagnosis not present

## 2020-05-06 DIAGNOSIS — N189 Chronic kidney disease, unspecified: Secondary | ICD-10-CM | POA: Diagnosis not present

## 2020-05-07 LAB — CMP14+EGFR
ALT: 23 IU/L (ref 0–44)
AST: 25 IU/L (ref 0–40)
Albumin/Globulin Ratio: 1.6 (ref 1.2–2.2)
Albumin: 4.8 g/dL (ref 3.8–4.9)
Alkaline Phosphatase: 65 IU/L (ref 44–121)
BUN/Creatinine Ratio: 10 (ref 9–20)
BUN: 14 mg/dL (ref 6–24)
Bilirubin Total: 0.4 mg/dL (ref 0.0–1.2)
CO2: 25 mmol/L (ref 20–29)
Calcium: 9.6 mg/dL (ref 8.7–10.2)
Chloride: 105 mmol/L (ref 96–106)
Creatinine, Ser: 1.38 mg/dL — ABNORMAL HIGH (ref 0.76–1.27)
GFR calc Af Amer: 66 mL/min/{1.73_m2} (ref >59)
GFR calc non Af Amer: 57 mL/min/{1.73_m2} — ABNORMAL LOW (ref >59)
Globulin, Total: 3 g/dL (ref 1.5–4.5)
Glucose: 75 mg/dL (ref 65–99)
Potassium: 4.1 mmol/L (ref 3.5–5.2)
Sodium: 144 mmol/L (ref 134–144)
Total Protein: 7.8 g/dL (ref 6.0–8.5)

## 2020-05-07 LAB — CBC
Hematocrit: 37.9 % (ref 37.5–51.0)
Hemoglobin: 11.5 g/dL — ABNORMAL LOW (ref 13.0–17.7)
MCH: 21.9 pg — ABNORMAL LOW (ref 26.6–33.0)
MCHC: 30.3 g/dL — ABNORMAL LOW (ref 31.5–35.7)
MCV: 72 fL — ABNORMAL LOW (ref 79–97)
Platelets: 287 10*3/uL (ref 150–450)
RBC: 5.25 x10E6/uL (ref 4.14–5.80)
RDW: 14.4 % (ref 11.6–15.4)
WBC: 8.7 10*3/uL (ref 3.4–10.8)

## 2020-05-07 LAB — LIPID PANEL
Chol/HDL Ratio: 3.4 ratio (ref 0.0–5.0)
Cholesterol, Total: 110 mg/dL (ref 100–199)
HDL: 32 mg/dL — ABNORMAL LOW (ref >39)
LDL Chol Calc (NIH): 58 mg/dL (ref 0–99)
Triglycerides: 105 mg/dL (ref 0–149)
VLDL Cholesterol Cal: 20 mg/dL (ref 5–40)

## 2020-05-07 LAB — MICROALBUMIN, URINE: Microalbumin, Urine: 1535.1 ug/mL

## 2020-05-07 LAB — PSA: Prostate Specific Ag, Serum: 0.5 ng/mL (ref 0.0–4.0)

## 2020-05-07 LAB — HEMOGLOBIN A1C
Est. average glucose Bld gHb Est-mCnc: 137 mg/dL
Hgb A1c MFr Bld: 6.4 % — ABNORMAL HIGH (ref 4.8–5.6)

## 2020-05-10 DIAGNOSIS — I129 Hypertensive chronic kidney disease with stage 1 through stage 4 chronic kidney disease, or unspecified chronic kidney disease: Secondary | ICD-10-CM | POA: Diagnosis not present

## 2020-05-10 DIAGNOSIS — D638 Anemia in other chronic diseases classified elsewhere: Secondary | ICD-10-CM | POA: Diagnosis not present

## 2020-05-10 DIAGNOSIS — R809 Proteinuria, unspecified: Secondary | ICD-10-CM | POA: Diagnosis not present

## 2020-05-10 DIAGNOSIS — N189 Chronic kidney disease, unspecified: Secondary | ICD-10-CM | POA: Diagnosis not present

## 2020-05-12 ENCOUNTER — Encounter: Payer: Self-pay | Admitting: Family Medicine

## 2020-05-13 ENCOUNTER — Other Ambulatory Visit: Payer: Self-pay | Admitting: Family Medicine

## 2020-05-13 DIAGNOSIS — E119 Type 2 diabetes mellitus without complications: Secondary | ICD-10-CM

## 2020-05-13 DIAGNOSIS — E785 Hyperlipidemia, unspecified: Secondary | ICD-10-CM

## 2020-06-05 ENCOUNTER — Encounter: Payer: Self-pay | Admitting: Family Medicine

## 2020-06-05 ENCOUNTER — Other Ambulatory Visit: Payer: Self-pay

## 2020-06-05 ENCOUNTER — Ambulatory Visit: Payer: BC Managed Care – PPO | Admitting: Family Medicine

## 2020-06-05 VITALS — BP 133/82 | HR 90 | Temp 97.2°F | Wt 211.0 lb

## 2020-06-05 DIAGNOSIS — E119 Type 2 diabetes mellitus without complications: Secondary | ICD-10-CM | POA: Diagnosis not present

## 2020-06-05 DIAGNOSIS — I10 Essential (primary) hypertension: Secondary | ICD-10-CM | POA: Diagnosis not present

## 2020-06-05 DIAGNOSIS — E785 Hyperlipidemia, unspecified: Secondary | ICD-10-CM | POA: Diagnosis not present

## 2020-06-05 NOTE — Progress Notes (Signed)
Patient ID: Andrew Duarte, male    DOB: Aug 31, 1965, 55 y.o.   MRN: [id number]   Chief Complaint  Patient presents with  . Hypertension   Subjective:    HPI   Pt here for follow up on HTN and heart rate. Kidney doctor placed pt on Amlodipine 5 mg tablets. Pt reports no issues.   Pt seen by kidney doctor and started amlopidine 25m. Has been on it about 2 wks.  Seeing them again on firday. Also on enalapril 254mat ngiht. Cr slight elevated at 1.38 in 1/22. microalbumin is high.  96-112 on last year of HR. Today is 106.  Recheck 133/82 and hr 90.   Dm2- Compliant with medications. Checking blood glucose.   Not seeing any high or low numbers.  Denies polyuria or polydipsia.   HLD- doing well no new concerns.  Compliant with meds. No chest pain, palpitations, myalgias or joint pains.   Medical History RoOrsonas a past medical history of Diabetes mellitus without complication ([place] Hypercholesteremia, and Hypertension.   Outpatient Encounter Medications as of 06/05/2020  Medication Sig  . amLODipine (NORVASC) 5 MG tablet Take 5 mg by mouth daily.  . Marland Kitchentorvastatin (LIPITOR) 40 MG tablet TAKE 1 TABLET BY MOUTH EVERYDAY AT BEDTIME  . Blood Glucose Monitoring Suppl (BLOOD GLUCOSE MONITOR SYSTEM) w/Device KIT Dispense based on patient's preference or insurance. Test blood sugar once a day. Dispense lancets and strips.Dx Diabetes # E11.9  . enalapril (VASOTEC) 20 MG tablet Take 1 tablet (20 mg total) by mouth daily with supper.  . Marland KitchenlipiZIDE (GLUCOTROL) 5 MG tablet TAKE 1 TABLET BY MOUTH DAILY BEFORE BREAKFAST  . metFORMIN (GLUCOPHAGE) 500 MG tablet TAKE 2 TABLETS BY MOUTH TWICE A DAY   No facility-administered encounter medications on file as of 06/05/2020.     Review of Systems  Constitutional: Negative for chills and fever.  HENT: Negative for congestion, rhinorrhea and sore throat.   Respiratory: Negative for cough, shortness of breath and wheezing.    Cardiovascular: Negative for chest pain and leg swelling.  Gastrointestinal: Negative for abdominal pain, diarrhea, nausea and vomiting.  Genitourinary: Negative for dysuria and frequency.  Skin: Negative for rash.  Neurological: Negative for dizziness, weakness and headaches.     Vitals BP 133/82   Pulse 90   Temp (!) 97.2 F (36.2 C)   Wt 211 lb (95.7 kg)   SpO2 99%   BMI 30.28 kg/m   Objective:   Physical Exam Vitals and nursing note reviewed.  Constitutional:      General: He is not in acute distress.    Appearance: Normal appearance. He is not ill-appearing.  HENT:     Head: Normocephalic.     Nose: Nose normal. No congestion.     Mouth/Throat:     Mouth: Mucous membranes are moist.     Pharynx: No oropharyngeal exudate.  Eyes:     Extraocular Movements: Extraocular movements intact.     Conjunctiva/sclera: Conjunctivae normal.     Pupils: Pupils are equal, round, and reactive to light.  Cardiovascular:     Rate and Rhythm: Normal rate and regular rhythm.     Pulses: Normal pulses.     Heart sounds: Normal heart sounds. No murmur heard.   Pulmonary:     Effort: Pulmonary effort is normal.     Breath sounds: Normal breath sounds. No wheezing, rhonchi or rales.  Musculoskeletal:        General: Normal range of motion.  Right lower leg: No edema.     Left lower leg: No edema.  Skin:    General: Skin is warm and dry.     Findings: No rash.  Neurological:     General: No focal deficit present.     Mental Status: He is alert and oriented to person, place, and time.     Cranial Nerves: No cranial nerve deficit.  Psychiatric:        Mood and Affect: Mood normal.        Behavior: Behavior normal.        Thought Content: Thought content normal.        Judgment: Judgment normal.      Assessment and Plan   1. Essential hypertension  2. Type 2 diabetes mellitus without complication, without long-term current use of insulin ([place])  3. Hyperlipidemia  LDL goal <100   dm2- stable. Cont meds. Reviewed labs from 1/22- stable. a1c 6.4.   HTN-suboptimal.  Patient to continue to watch salt in his diet and continue medications.  Hyperlipidemia -stable, patient to continue medications.   Return in about 6 months (around 12/06/2020) for f/u dm, htn.

## 2020-08-05 DIAGNOSIS — R809 Proteinuria, unspecified: Secondary | ICD-10-CM | POA: Diagnosis not present

## 2020-08-05 DIAGNOSIS — D638 Anemia in other chronic diseases classified elsewhere: Secondary | ICD-10-CM | POA: Diagnosis not present

## 2020-08-05 DIAGNOSIS — N189 Chronic kidney disease, unspecified: Secondary | ICD-10-CM | POA: Diagnosis not present

## 2020-08-05 DIAGNOSIS — E1122 Type 2 diabetes mellitus with diabetic chronic kidney disease: Secondary | ICD-10-CM | POA: Diagnosis not present

## 2020-08-16 DIAGNOSIS — D638 Anemia in other chronic diseases classified elsewhere: Secondary | ICD-10-CM | POA: Diagnosis not present

## 2020-08-16 DIAGNOSIS — I129 Hypertensive chronic kidney disease with stage 1 through stage 4 chronic kidney disease, or unspecified chronic kidney disease: Secondary | ICD-10-CM | POA: Diagnosis not present

## 2020-08-16 DIAGNOSIS — R809 Proteinuria, unspecified: Secondary | ICD-10-CM | POA: Diagnosis not present

## 2020-08-16 DIAGNOSIS — N189 Chronic kidney disease, unspecified: Secondary | ICD-10-CM | POA: Diagnosis not present

## 2020-08-26 ENCOUNTER — Other Ambulatory Visit: Payer: Self-pay | Admitting: Family Medicine

## 2020-08-26 DIAGNOSIS — E119 Type 2 diabetes mellitus without complications: Secondary | ICD-10-CM

## 2020-10-08 ENCOUNTER — Other Ambulatory Visit: Payer: Self-pay | Admitting: Family Medicine

## 2020-10-08 DIAGNOSIS — E119 Type 2 diabetes mellitus without complications: Secondary | ICD-10-CM

## 2020-10-18 ENCOUNTER — Other Ambulatory Visit: Payer: Self-pay | Admitting: Family Medicine

## 2020-10-18 DIAGNOSIS — E785 Hyperlipidemia, unspecified: Secondary | ICD-10-CM

## 2020-11-18 DIAGNOSIS — E1122 Type 2 diabetes mellitus with diabetic chronic kidney disease: Secondary | ICD-10-CM | POA: Diagnosis not present

## 2020-11-18 DIAGNOSIS — R809 Proteinuria, unspecified: Secondary | ICD-10-CM | POA: Diagnosis not present

## 2020-11-18 DIAGNOSIS — E1129 Type 2 diabetes mellitus with other diabetic kidney complication: Secondary | ICD-10-CM | POA: Diagnosis not present

## 2020-11-18 DIAGNOSIS — N189 Chronic kidney disease, unspecified: Secondary | ICD-10-CM | POA: Diagnosis not present

## 2020-11-22 DIAGNOSIS — N189 Chronic kidney disease, unspecified: Secondary | ICD-10-CM | POA: Diagnosis not present

## 2020-11-22 DIAGNOSIS — D638 Anemia in other chronic diseases classified elsewhere: Secondary | ICD-10-CM | POA: Diagnosis not present

## 2020-11-22 DIAGNOSIS — R809 Proteinuria, unspecified: Secondary | ICD-10-CM | POA: Diagnosis not present

## 2020-11-22 DIAGNOSIS — I129 Hypertensive chronic kidney disease with stage 1 through stage 4 chronic kidney disease, or unspecified chronic kidney disease: Secondary | ICD-10-CM | POA: Diagnosis not present

## 2020-11-29 ENCOUNTER — Other Ambulatory Visit: Payer: Self-pay | Admitting: Family Medicine

## 2020-11-29 DIAGNOSIS — E119 Type 2 diabetes mellitus without complications: Secondary | ICD-10-CM

## 2021-01-12 ENCOUNTER — Other Ambulatory Visit: Payer: Self-pay | Admitting: Family Medicine

## 2021-01-12 DIAGNOSIS — E119 Type 2 diabetes mellitus without complications: Secondary | ICD-10-CM

## 2021-01-13 NOTE — Telephone Encounter (Signed)
Please contact patient to have him set up appt with Dr.Cook. thank you!

## 2021-01-13 NOTE — Telephone Encounter (Signed)
Sent my chart message to schedule appointment 10/10//22

## 2021-01-25 ENCOUNTER — Other Ambulatory Visit: Payer: Self-pay | Admitting: Family Medicine

## 2021-01-25 DIAGNOSIS — E785 Hyperlipidemia, unspecified: Secondary | ICD-10-CM

## 2021-02-05 NOTE — Telephone Encounter (Signed)
Second request to schedule appointment for medication follow up 02/05/21

## 2021-02-19 ENCOUNTER — Encounter: Payer: Self-pay | Admitting: Family Medicine

## 2021-02-19 ENCOUNTER — Ambulatory Visit: Payer: BC Managed Care – PPO | Admitting: Family Medicine

## 2021-02-19 ENCOUNTER — Other Ambulatory Visit: Payer: Self-pay

## 2021-02-19 VITALS — BP 158/88 | HR 103 | Temp 97.9°F | Ht 70.0 in | Wt 216.2 lb

## 2021-02-19 DIAGNOSIS — N1831 Chronic kidney disease, stage 3a: Secondary | ICD-10-CM | POA: Insufficient documentation

## 2021-02-19 DIAGNOSIS — I10 Essential (primary) hypertension: Secondary | ICD-10-CM | POA: Diagnosis not present

## 2021-02-19 DIAGNOSIS — E785 Hyperlipidemia, unspecified: Secondary | ICD-10-CM

## 2021-02-19 DIAGNOSIS — E1122 Type 2 diabetes mellitus with diabetic chronic kidney disease: Secondary | ICD-10-CM

## 2021-02-19 MED ORDER — AMLODIPINE BESYLATE 10 MG PO TABS: 10.0000 mg | ORAL_TABLET | Freq: Every day | ORAL | 3 refills | Status: DC

## 2021-02-19 NOTE — Progress Notes (Signed)
Subjective:  Patient ID: Andrew Duarte, male    DOB: 29-Sep-1965  Age: 55 y.o. MRN: [id number]  CC: Chief Complaint  Patient presents with   Establish Care    Blood pressure and sugars have been doing good.     HPI:  55 year old male with type 2 diabetes with complications, CKD stage III, hypertension, hyperlipidemia presents to establish care with me and to follow-up.  DM-2  Blood sugars readings - 70-90 fasting. Hypoglycemia - No. Medications - Metformin Adverse effects - No. Compliance - Yes. Preventative care Eye exam - In need of. Foot exam - Foot exam today. Last A1C - Needs A1C. Urine microalbumin - Followed closely by nephrology  HTN Currently on enalapril and amlodipine.  BP elevated today.  Followed by nephrology as well.  Hyperlipidemia Has been stable/well-controlled.  Last lipid panel revealed LDL of 58.  Currently on Lipitor and tolerating well.   Patient Active Problem List   Diagnosis Date Noted   Type 2 diabetes mellitus with stage 3a chronic kidney disease, without long-term current use of insulin ([place]) 02/19/2021   Special screening for malignant neoplasms, colon 01/20/2018   Diabetic nephropathy with proteinuria ([place]) 02/24/2016   Essential hypertension 02/24/2016   Hyperlipidemia 05/03/2015   Elevated blood pressure 05/03/2015    Social Hx   Social History   Socioeconomic History   Marital status: Single    Spouse name: Not on file   Number of children: Not on file   Years of education: Not on file   Highest education level: Not on file  Occupational History   Not on file  Tobacco Use   Smoking status: Never   Smokeless tobacco: Never  Vaping Use   Vaping Use: Never used  Substance and Sexual Activity   Alcohol use: Not Currently    Alcohol/week: 0.0 standard drinks   Drug use: Not Currently   Sexual activity: Not on file  Other Topics Concern   Not on file  Social History Narrative   Not on file   Social Determinants of  Health   Financial Resource Strain: Not on file  Food Insecurity: Not on file  Transportation Needs: Not on file  Physical Activity: Not on file  Stress: Not on file  Social Connections: Not on file    Review of Systems  Constitutional: Negative.   Respiratory: Negative.    Cardiovascular: Negative.     Objective:  BP (!) 158/88   Pulse (!) 103   Temp 97.9 F (36.6 C)   Ht 5\' 10"  (1.778 m)   Wt 216 lb 3.2 oz (98.1 kg)   SpO2 97%   BMI 31.02 kg/m   BP/Weight 02/19/2021 06/05/2020 9/45/0388  Systolic BP 828 003 491  Diastolic BP 88 82 94  Wt. (Lbs) 216.2 211 213.8  BMI 31.02 30.28 30.68    Physical Exam Vitals and nursing note reviewed.  Constitutional:      General: He is not in acute distress.    Appearance: Normal appearance. He is not ill-appearing.  HENT:     Head: Normocephalic and atraumatic.  Eyes:     General:        Right eye: No discharge.        Left eye: No discharge.     Conjunctiva/sclera: Conjunctivae normal.  Cardiovascular:     Rate and Rhythm: Normal rate and regular rhythm.  Pulmonary:     Effort: Pulmonary effort is normal.     Breath sounds: Normal breath sounds. No  wheezing, rhonchi or rales.  Neurological:     Mental Status: He is alert.  Psychiatric:        Mood and Affect: Mood normal.        Behavior: Behavior normal.  Diabetic Foot Check -  Appearance - no lesions, ulcers or calluses Skin - no unusual pallor or redness Monofilament testing -  Right - Great toe, medial, central, lateral ball and posterior foot intact Left - Great toe, medial, central, lateral ball and posterior foot intact   Lab Results  Component Value Date   WBC 8.7 05/06/2020   HGB 11.5 (L) 05/06/2020   HCT 37.9 05/06/2020   PLT 287 05/06/2020   GLUCOSE 75 05/06/2020   CHOL 110 05/06/2020   TRIG 105 05/06/2020   HDL 32 (L) 05/06/2020   LDLCALC 58 05/06/2020   ALT 23 05/06/2020   AST 25 05/06/2020   NA 144 05/06/2020   K 4.1 05/06/2020   CL 105  05/06/2020   CREATININE 1.38 (H) 05/06/2020   BUN 14 05/06/2020   CO2 25 05/06/2020   HGBA1C 6.4 (H) 05/06/2020     Assessment & Plan:   Problem List Items Addressed This Visit       Cardiovascular and Mediastinum   Essential hypertension    BP elevated today and on repeat. Increasing Norvasc to 10 mg daily. Advised to check BP at home. Follow up in 3 months.      Relevant Medications   amLODipine (NORVASC) 10 MG tablet     Endocrine   Type 2 diabetes mellitus with stage 3a chronic kidney disease, without long-term current use of insulin ([place]) - Primary    Well controlled. A1C ordered. Continue Metformin. Current dosing is okay (renal function allows).      Relevant Orders   Hemoglobin A1c     Other   Hyperlipidemia    Has been well controlled. Continue Lipitor. Lipid panel ordered.       Relevant Medications   amLODipine (NORVASC) 10 MG tablet   Other Relevant Orders   Lipid panel    Meds ordered this encounter  Medications   amLODipine (NORVASC) 10 MG tablet    Sig: Take 1 tablet (10 mg total) by mouth daily.    Dispense:  90 tablet    Refill:  3    Follow-up:  Return in about 3 months (around 05/22/2021).  [place]

## 2021-02-19 NOTE — Patient Instructions (Signed)
Labs when you can.  I have increased the Amlodipine.  Check BP at home. Goal <130/80.  Follow up in 3 months.  Take care  Dr. Lacinda Axon

## 2021-02-19 NOTE — Assessment & Plan Note (Signed)
Has been well controlled. Continue Lipitor. Lipid panel ordered.

## 2021-02-19 NOTE — Assessment & Plan Note (Signed)
BP elevated today and on repeat. Increasing Norvasc to 10 mg daily. Advised to check BP at home. Follow up in 3 months.

## 2021-02-19 NOTE — Assessment & Plan Note (Signed)
Well controlled. A1C ordered. Continue Metformin. Current dosing is okay (renal function allows).

## 2021-03-03 DIAGNOSIS — N189 Chronic kidney disease, unspecified: Secondary | ICD-10-CM | POA: Diagnosis not present

## 2021-03-03 DIAGNOSIS — E1129 Type 2 diabetes mellitus with other diabetic kidney complication: Secondary | ICD-10-CM | POA: Diagnosis not present

## 2021-03-03 DIAGNOSIS — R809 Proteinuria, unspecified: Secondary | ICD-10-CM | POA: Diagnosis not present

## 2021-03-03 DIAGNOSIS — E1122 Type 2 diabetes mellitus with diabetic chronic kidney disease: Secondary | ICD-10-CM | POA: Diagnosis not present

## 2021-03-07 DIAGNOSIS — N189 Chronic kidney disease, unspecified: Secondary | ICD-10-CM | POA: Diagnosis not present

## 2021-03-07 DIAGNOSIS — I129 Hypertensive chronic kidney disease with stage 1 through stage 4 chronic kidney disease, or unspecified chronic kidney disease: Secondary | ICD-10-CM | POA: Diagnosis not present

## 2021-03-07 DIAGNOSIS — D638 Anemia in other chronic diseases classified elsewhere: Secondary | ICD-10-CM | POA: Diagnosis not present

## 2021-03-07 DIAGNOSIS — R809 Proteinuria, unspecified: Secondary | ICD-10-CM | POA: Diagnosis not present

## 2021-03-09 ENCOUNTER — Other Ambulatory Visit: Payer: Self-pay | Admitting: Family Medicine

## 2021-03-09 DIAGNOSIS — E119 Type 2 diabetes mellitus without complications: Secondary | ICD-10-CM

## 2021-03-10 DIAGNOSIS — E1122 Type 2 diabetes mellitus with diabetic chronic kidney disease: Secondary | ICD-10-CM | POA: Diagnosis not present

## 2021-03-10 DIAGNOSIS — N1831 Chronic kidney disease, stage 3a: Secondary | ICD-10-CM | POA: Diagnosis not present

## 2021-03-10 DIAGNOSIS — E785 Hyperlipidemia, unspecified: Secondary | ICD-10-CM | POA: Diagnosis not present

## 2021-03-11 ENCOUNTER — Other Ambulatory Visit: Payer: Self-pay | Admitting: Family Medicine

## 2021-03-11 LAB — LIPID PANEL
Chol/HDL Ratio: 4 ratio (ref 0.0–5.0)
Cholesterol, Total: 125 mg/dL (ref 100–199)
HDL: 31 mg/dL — ABNORMAL LOW (ref >39)
LDL Chol Calc (NIH): 74 mg/dL (ref 0–99)
Triglycerides: 104 mg/dL (ref 0–149)
VLDL Cholesterol Cal: 20 mg/dL (ref 5–40)

## 2021-03-11 LAB — HEMOGLOBIN A1C
Est. average glucose Bld gHb Est-mCnc: 174 mg/dL
Hgb A1c MFr Bld: 7.7 % — ABNORMAL HIGH (ref 4.8–5.6)

## 2021-03-11 MED ORDER — EMPAGLIFLOZIN 10 MG PO TABS: ORAL_TABLET | ORAL | 1 refills | Status: DC

## 2021-04-16 IMAGING — US US RENAL
1 series · 14 of 25 positions shown · non-contrast
Comparison: None.

CLINICAL DATA: Diabetes type 2. Chronic kidney disease and
end-stage renal disease.

EXAM:
RENAL / URINARY TRACT ULTRASOUND COMPLETE

[Series 1: us renal · 14 of 37 slices shown]
[im 1/37]
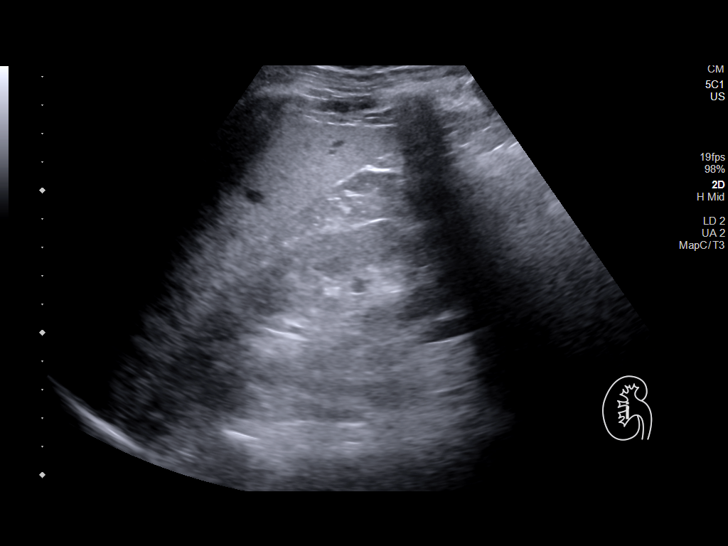
[im 4/37]
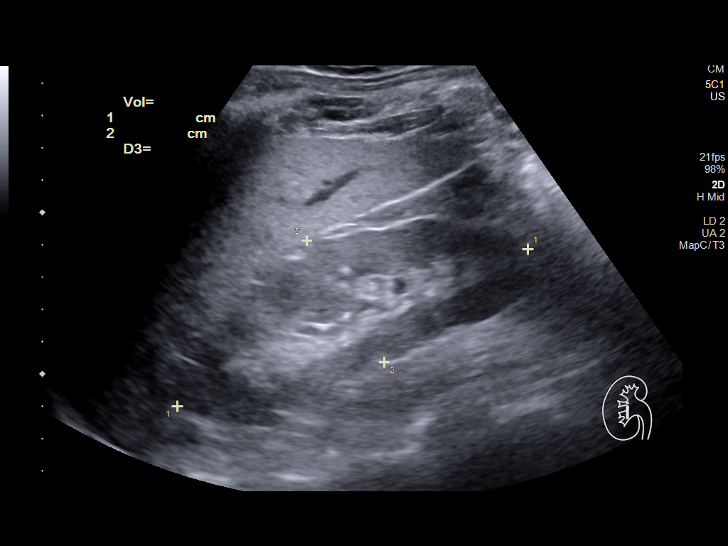
[im 7/37]
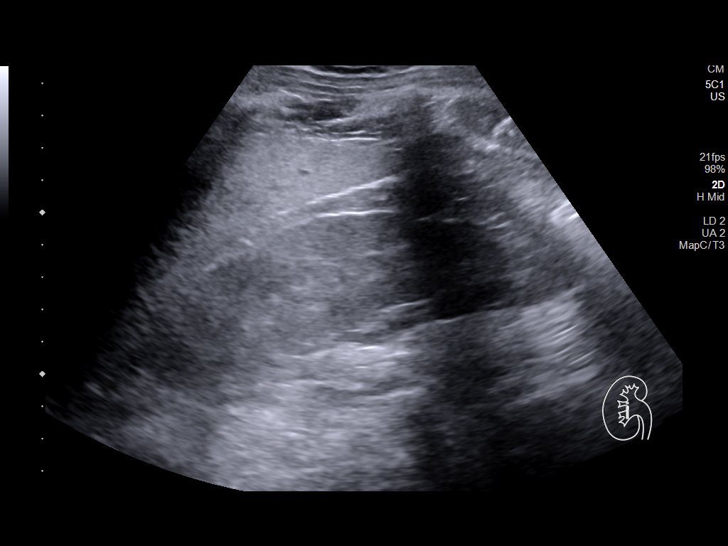
[im 10/37]
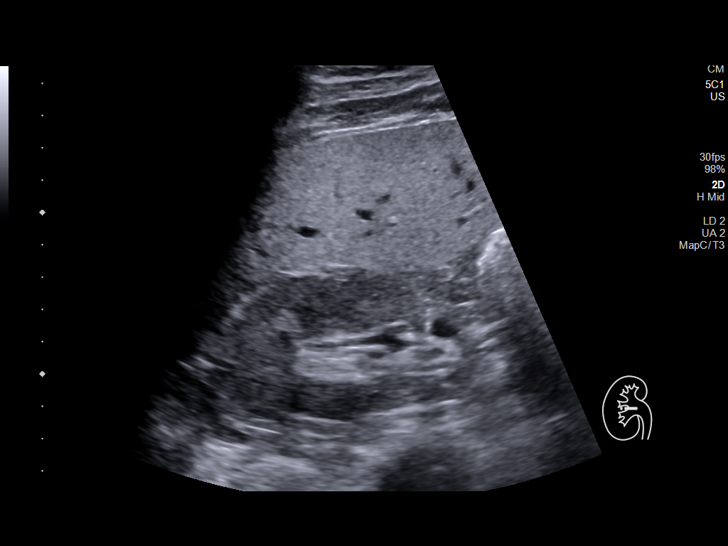
[im 13/37]
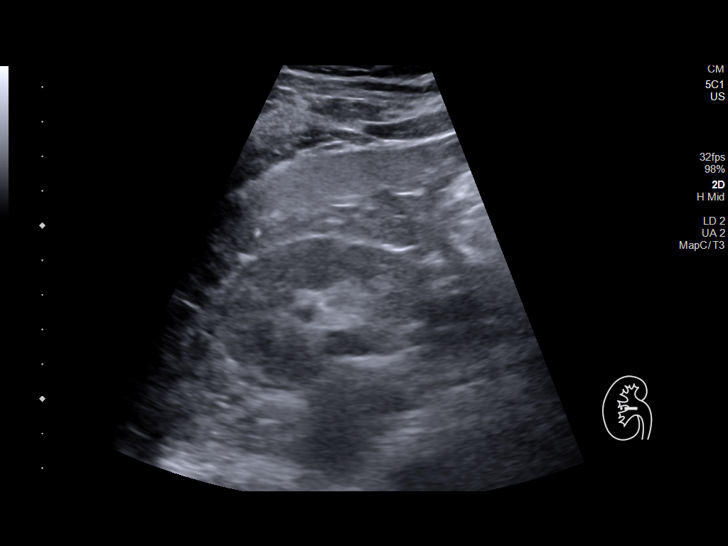
[im 14/37]
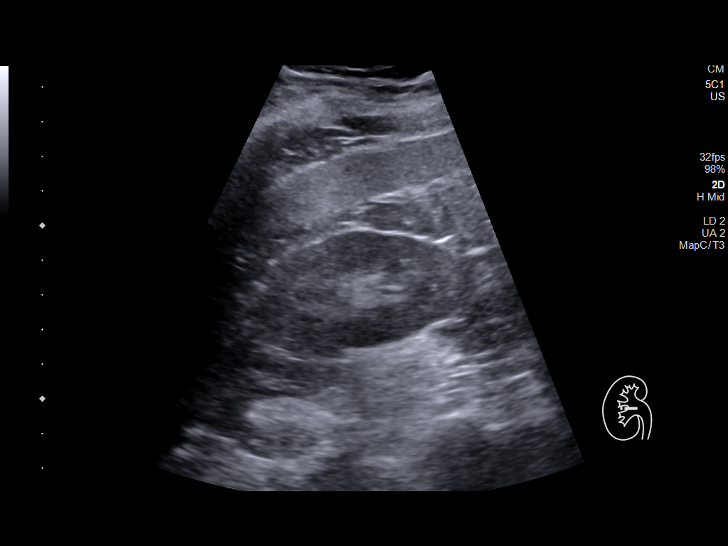
[im 17/37]
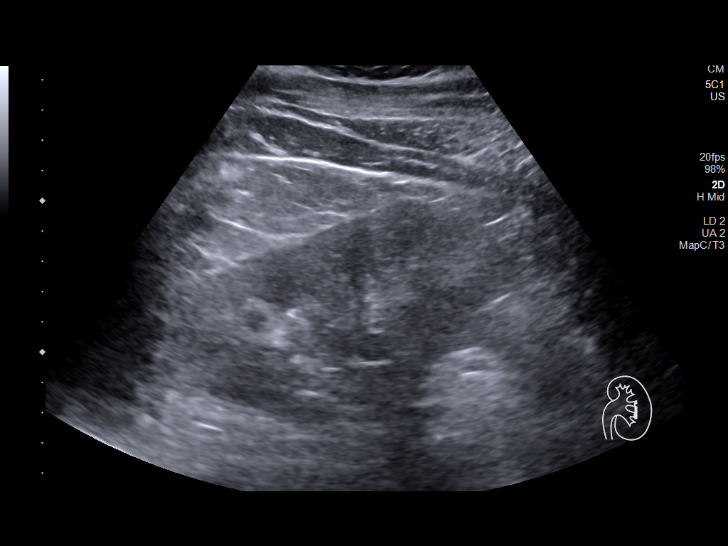
[im 20/37]
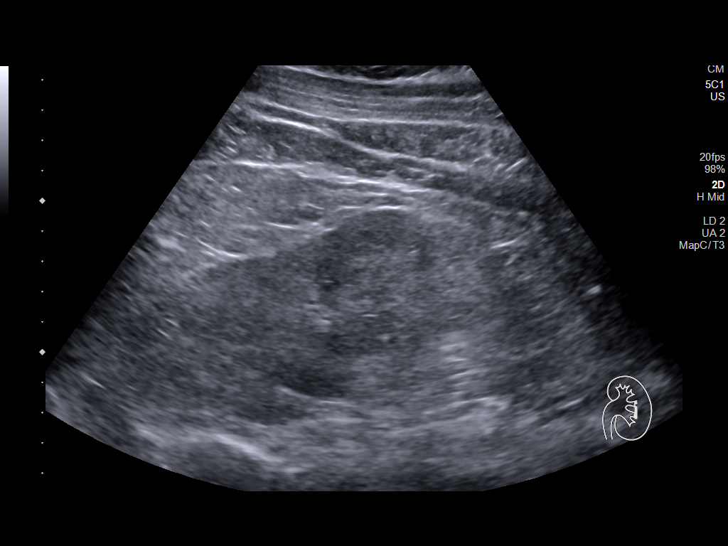
[im 23/37]
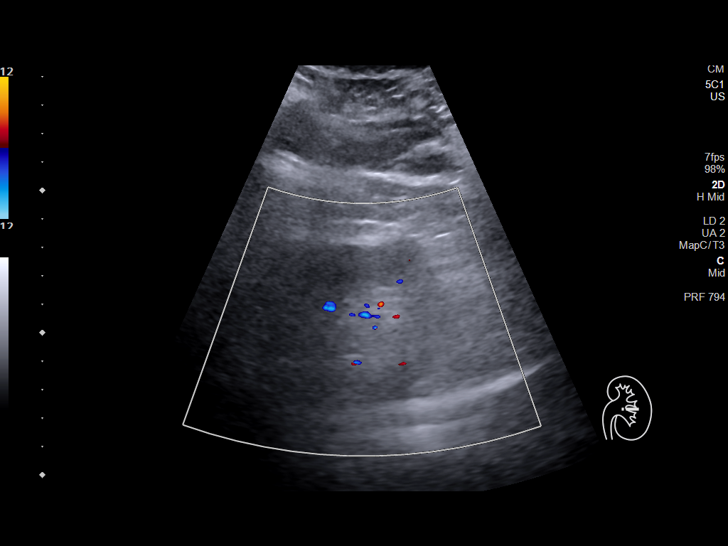
[im 25/37]
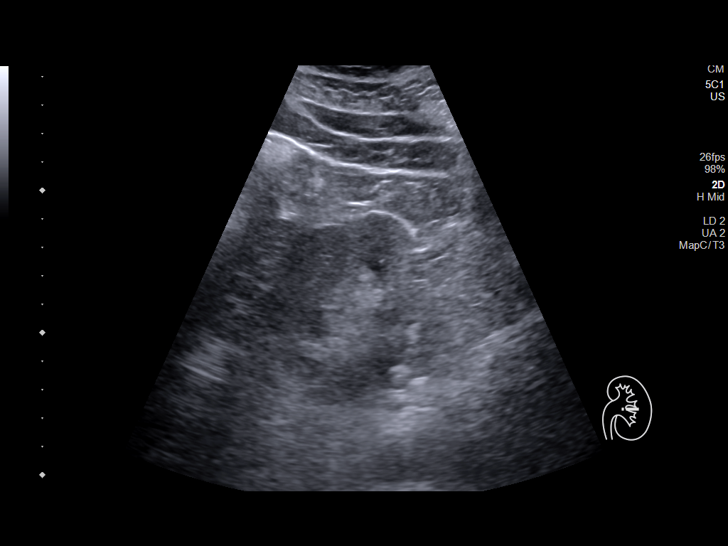
[im 28/37]
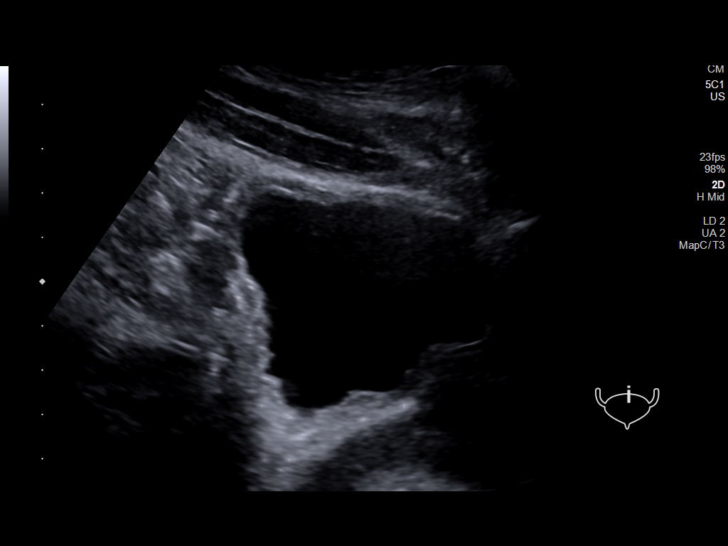
[im 31/37]
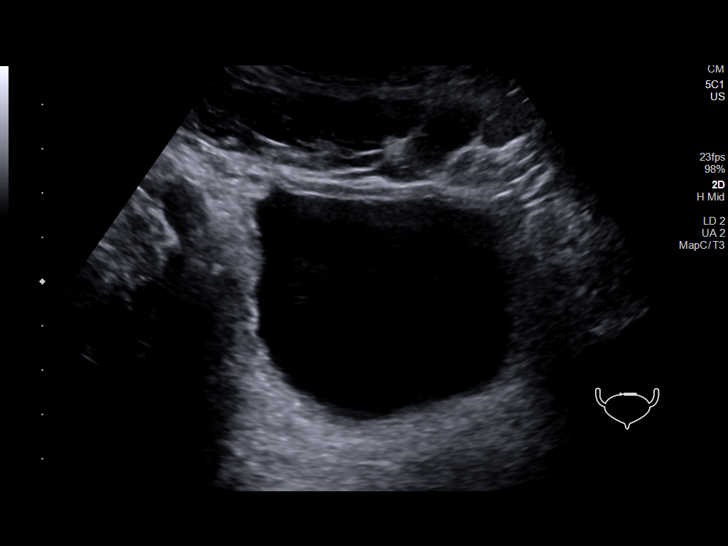
[im 34/37]
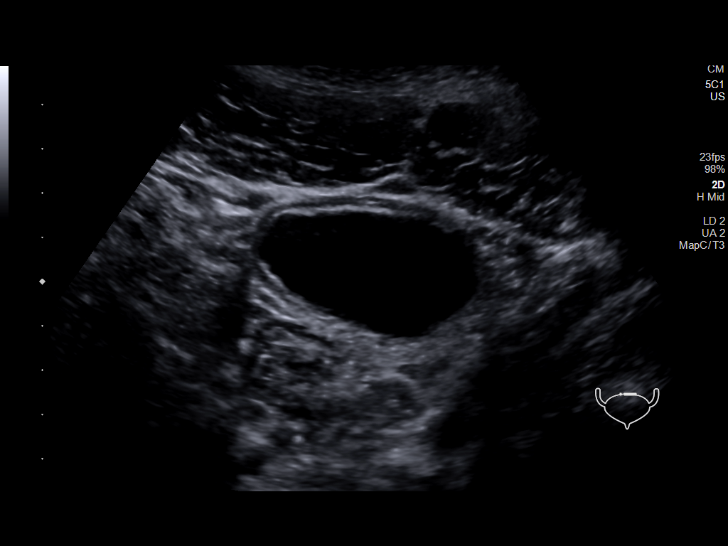
[im 37/37]
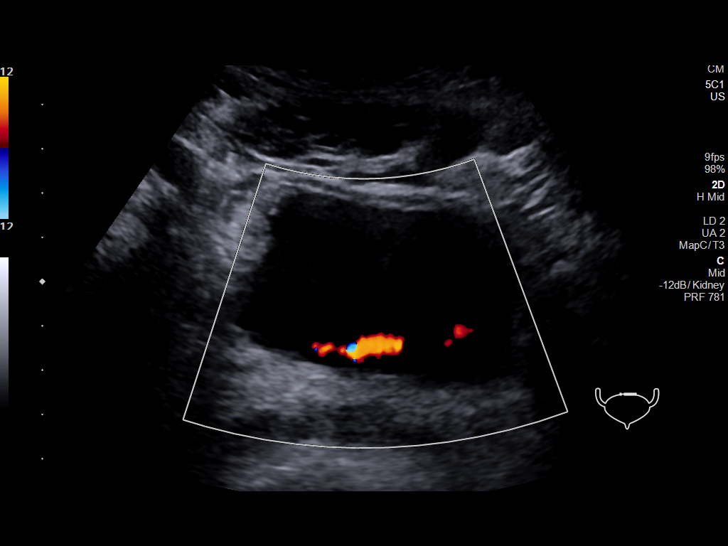

[14 of 25 positions shown; findings below may reference images not displayed]

FINDINGS: Right Kidney:

Renal measurements: 11.9 x 4.5 x 5.7 centimeter = volume: 158.3 mL .
Echogenicity within normal limits. No mass or hydronephrosis
visualized.

Left Kidney:

Renal measurements: 12.5 x 6.1 x 5.27 = volume: 0.20 7.4 mL.
Echogenicity within normal limits. No mass or hydronephrosis
visualized.

Bladder:

Appears normal for degree of bladder distention. Bilateral ureteral
jets are present.

Other:

None.
IMPRESSION: Normal renal ultrasound.

## 2021-04-30 ENCOUNTER — Other Ambulatory Visit: Payer: Self-pay | Admitting: Family Medicine

## 2021-04-30 DIAGNOSIS — E785 Hyperlipidemia, unspecified: Secondary | ICD-10-CM

## 2021-05-13 DIAGNOSIS — E1122 Type 2 diabetes mellitus with diabetic chronic kidney disease: Secondary | ICD-10-CM | POA: Diagnosis not present

## 2021-05-13 DIAGNOSIS — E559 Vitamin D deficiency, unspecified: Secondary | ICD-10-CM | POA: Diagnosis not present

## 2021-05-13 DIAGNOSIS — N189 Chronic kidney disease, unspecified: Secondary | ICD-10-CM | POA: Diagnosis not present

## 2021-05-13 DIAGNOSIS — E1129 Type 2 diabetes mellitus with other diabetic kidney complication: Secondary | ICD-10-CM | POA: Diagnosis not present

## 2021-05-16 DIAGNOSIS — N189 Chronic kidney disease, unspecified: Secondary | ICD-10-CM | POA: Diagnosis not present

## 2021-05-16 DIAGNOSIS — D638 Anemia in other chronic diseases classified elsewhere: Secondary | ICD-10-CM | POA: Diagnosis not present

## 2021-05-16 DIAGNOSIS — R809 Proteinuria, unspecified: Secondary | ICD-10-CM | POA: Diagnosis not present

## 2021-05-16 DIAGNOSIS — I129 Hypertensive chronic kidney disease with stage 1 through stage 4 chronic kidney disease, or unspecified chronic kidney disease: Secondary | ICD-10-CM | POA: Diagnosis not present

## 2021-05-22 ENCOUNTER — Other Ambulatory Visit: Payer: Self-pay

## 2021-05-22 ENCOUNTER — Ambulatory Visit: Payer: BC Managed Care – PPO | Admitting: Family Medicine

## 2021-05-22 DIAGNOSIS — E1122 Type 2 diabetes mellitus with diabetic chronic kidney disease: Secondary | ICD-10-CM

## 2021-05-22 DIAGNOSIS — I10 Essential (primary) hypertension: Secondary | ICD-10-CM

## 2021-05-22 DIAGNOSIS — N1831 Chronic kidney disease, stage 3a: Secondary | ICD-10-CM | POA: Diagnosis not present

## 2021-05-22 NOTE — Patient Instructions (Signed)
Start the [place].  Continue your other medications.  Follow up in 3 months.  Take care  Dr. Lacinda Axon

## 2021-05-23 DIAGNOSIS — N183 Chronic kidney disease, stage 3 unspecified: Secondary | ICD-10-CM | POA: Insufficient documentation

## 2021-05-23 DIAGNOSIS — N1832 Chronic kidney disease, stage 3b: Secondary | ICD-10-CM | POA: Insufficient documentation

## 2021-05-23 NOTE — Progress Notes (Signed)
Subjective:  Patient ID: Andrew Duarte, male    DOB: 26-Dec-1965  Age: 56 y.o. MRN: [id number]  CC: Chief Complaint  Patient presents with   Diabetes    Patient says he never got the [place]. He  was put on Dapagliflozin by kidney doctor. Patient will pick up prescription today    HPI:  56 year old male with type 2 diabetes, chronic kidney disease, hyperlipidemia presents for follow-up.  DM-2 Most recent A1c 7.7 (12/5). Has not yet started SGLT2.  He states that he did not receive a notification from the pharmacy.  Nephrology recently changed it to [place].  He will pick up the medication. Remains on metformin and glipizide.  CKD Followed closely by nephrology.  Recent addition of [place].  Hypertension BP much improved.  BP 138/84 today.  At nephrology visit it was 130/80.  He is compliant with amlodipine and enalapril.  Hyperlipidemia Well-controlled.  Most recent LDL 74.  Patient Active Problem List   Diagnosis Date Noted   CKD (chronic kidney disease) stage 3, GFR 30-59 ml/min (HCC) 05/23/2021   Type 2 diabetes mellitus with stage 3a chronic kidney disease, without long-term current use of insulin ([place]) 02/19/2021   Special screening for malignant neoplasms, colon 01/20/2018   Essential hypertension 02/24/2016   Hyperlipidemia 05/03/2015    Social Hx   Social History   Socioeconomic History   Marital status: Single    Spouse name: Not on file   Number of children: Not on file   Years of education: Not on file   Highest education level: Not on file  Occupational History   Not on file  Tobacco Use   Smoking status: Never   Smokeless tobacco: Never  Vaping Use   Vaping Use: Never used  Substance and Sexual Activity   Alcohol use: Not Currently    Alcohol/week: 0.0 standard drinks   Drug use: Not Currently   Sexual activity: Not on file  Other Topics Concern   Not on file  Social History Narrative   Not on file   Social Determinants of Health    Financial Resource Strain: Not on file  Food Insecurity: Not on file  Transportation Needs: Not on file  Physical Activity: Not on file  Stress: Not on file  Social Connections: Not on file    Review of Systems  Constitutional: Negative.   Respiratory: Negative.    Cardiovascular: Negative.    Objective:  BP 138/84    Pulse 66    Temp 99.3 F (37.4 C) (Oral)    Ht 5\' 10"  (1.778 m)    Wt 211 lb 3.2 oz (95.8 kg)    SpO2 96%    BMI 30.30 kg/m   BP/Weight 05/22/2021 42/70/6237 09/05/8313  Systolic BP 176 160 737  Diastolic BP 84 88 82  Wt. (Lbs) 211.2 216.2 211  BMI 30.3 31.02 30.28    Physical Exam Vitals and nursing note reviewed.  Constitutional:      Appearance: Normal appearance.  HENT:     Head: Normocephalic and atraumatic.  Cardiovascular:     Rate and Rhythm: Normal rate and regular rhythm.  Pulmonary:     Effort: Pulmonary effort is normal.     Breath sounds: Normal breath sounds.  Neurological:     Mental Status: He is alert.  Psychiatric:        Mood and Affect: Mood normal.        Behavior: Behavior normal.    Lab Results  Component Value Date  WBC 8.7 05/06/2020   HGB 11.5 (L) 05/06/2020   HCT 37.9 05/06/2020   PLT 287 05/06/2020   GLUCOSE 75 05/06/2020   CHOL 125 03/10/2021   TRIG 104 03/10/2021   HDL 31 (L) 03/10/2021   LDLCALC 74 03/10/2021   ALT 23 05/06/2020   AST 25 05/06/2020   NA 144 05/06/2020   K 4.1 05/06/2020   CL 105 05/06/2020   CREATININE 1.38 (H) 05/06/2020   BUN 14 05/06/2020   CO2 25 05/06/2020   HGBA1C 7.7 (H) 03/10/2021     Assessment & Plan:   Problem List Items Addressed This Visit       Cardiovascular and Mediastinum   Essential hypertension    BP improved and doing well at this time. Continue amlodipine and enalapril.        Endocrine   Type 2 diabetes mellitus with stage 3a chronic kidney disease, without long-term current use of insulin (HCC)    Continue metformin and glipizide. Advised to start  [place].        Genitourinary   CKD (chronic kidney disease) stage 3, GFR 30-59 ml/min (HCC)    Continue close follow-up with nephrology.  Start [place].        Follow-up:  Return in about 3 months (around 08/19/2021) for Diabetes follow up.  [place]

## 2021-05-23 NOTE — Assessment & Plan Note (Signed)
BP improved and doing well at this time. Continue amlodipine and enalapril.

## 2021-05-23 NOTE — Assessment & Plan Note (Signed)
Continue metformin and glipizide. Advised to start [place].

## 2021-05-23 NOTE — Assessment & Plan Note (Signed)
Continue close follow-up with nephrology.  Start [place].

## 2021-06-23 ENCOUNTER — Other Ambulatory Visit: Payer: Self-pay | Admitting: Family Medicine

## 2021-06-23 DIAGNOSIS — E119 Type 2 diabetes mellitus without complications: Secondary | ICD-10-CM

## 2021-07-16 ENCOUNTER — Other Ambulatory Visit: Payer: Self-pay | Admitting: Family Medicine

## 2021-07-16 ENCOUNTER — Telehealth: Payer: Self-pay | Admitting: Family Medicine

## 2021-07-16 NOTE — Telephone Encounter (Signed)
Pt left voicemail requesting refill on Contour Test Strips. Attempted to contact patient to see how many times a day he test and verify pharmacy. Voicemail is full;unable to leave message ?

## 2021-07-24 MED ORDER — BLOOD GLUCOSE TEST STRIPS 333 VI STRP: ORAL_STRIP | 5 refills | Status: AC

## 2021-07-24 NOTE — Telephone Encounter (Signed)
Contacted pt to see how many times a day he test and verify pharmacy. Pt is testing at least twice a day and uses [facility]. Test strip script sent electronically to pharmacy  ?

## 2021-07-24 NOTE — Addendum Note (Signed)
Addended by: Vicente Males on: 07/24/2021 11:01 AM ? ? Modules accepted: Orders ? ?

## 2021-07-31 ENCOUNTER — Other Ambulatory Visit: Payer: Self-pay

## 2021-07-31 DIAGNOSIS — E785 Hyperlipidemia, unspecified: Secondary | ICD-10-CM

## 2021-07-31 MED ORDER — ATORVASTATIN CALCIUM 40 MG PO TABS: ORAL_TABLET | ORAL | 0 refills | Status: DC

## 2021-08-18 ENCOUNTER — Telehealth: Payer: Self-pay | Admitting: Family Medicine

## 2021-08-18 NOTE — Telephone Encounter (Signed)
Last labs 12/22: Lipid and HgbA1c ?

## 2021-08-18 NOTE — Telephone Encounter (Signed)
Patient is wanting to know if he needs labs done for his appointment on 5/18 ?

## 2021-08-19 ENCOUNTER — Other Ambulatory Visit: Payer: Self-pay | Admitting: *Deleted

## 2021-08-19 DIAGNOSIS — E1122 Type 2 diabetes mellitus with diabetic chronic kidney disease: Secondary | ICD-10-CM | POA: Diagnosis not present

## 2021-08-19 DIAGNOSIS — Z125 Encounter for screening for malignant neoplasm of prostate: Secondary | ICD-10-CM | POA: Diagnosis not present

## 2021-08-19 DIAGNOSIS — E785 Hyperlipidemia, unspecified: Secondary | ICD-10-CM

## 2021-08-19 DIAGNOSIS — I10 Essential (primary) hypertension: Secondary | ICD-10-CM | POA: Diagnosis not present

## 2021-08-19 DIAGNOSIS — N1831 Chronic kidney disease, stage 3a: Secondary | ICD-10-CM

## 2021-08-20 LAB — CBC WITH DIFFERENTIAL/PLATELET
Basophils Absolute: 0.1 10*3/uL (ref 0.0–0.2)
Basos: 1 %
EOS (ABSOLUTE): 0.2 10*3/uL (ref 0.0–0.4)
Eos: 3 %
Hematocrit: 37.7 % (ref 37.5–51.0)
Hemoglobin: 11.6 g/dL — ABNORMAL LOW (ref 13.0–17.7)
Immature Grans (Abs): 0 10*3/uL (ref 0.0–0.1)
Immature Granulocytes: 0 %
Lymphocytes Absolute: 2.5 10*3/uL (ref 0.7–3.1)
Lymphs: 26 %
MCH: 22.7 pg — ABNORMAL LOW (ref 26.6–33.0)
MCHC: 30.8 g/dL — ABNORMAL LOW (ref 31.5–35.7)
MCV: 74 fL — ABNORMAL LOW (ref 79–97)
Monocytes Absolute: 0.8 10*3/uL (ref 0.1–0.9)
Monocytes: 8 %
Neutrophils Absolute: 6.1 10*3/uL (ref 1.4–7.0)
Neutrophils: 62 %
Platelets: 290 10*3/uL (ref 150–450)
RBC: 5.12 x10E6/uL (ref 4.14–5.80)
RDW: 14.6 % (ref 11.6–15.4)
WBC: 9.7 10*3/uL (ref 3.4–10.8)

## 2021-08-20 LAB — CMP14+EGFR
ALT: 26 IU/L (ref 0–44)
AST: 27 IU/L (ref 0–40)
Albumin/Globulin Ratio: 1.8 (ref 1.2–2.2)
Albumin: 5.2 g/dL — ABNORMAL HIGH (ref 3.8–4.9)
Alkaline Phosphatase: 58 IU/L (ref 44–121)
BUN/Creatinine Ratio: 13 (ref 9–20)
BUN: 20 mg/dL (ref 6–24)
Bilirubin Total: 0.3 mg/dL (ref 0.0–1.2)
CO2: 19 mmol/L — ABNORMAL LOW (ref 20–29)
Calcium: 10.1 mg/dL (ref 8.7–10.2)
Chloride: 107 mmol/L — ABNORMAL HIGH (ref 96–106)
Creatinine, Ser: 1.52 mg/dL — ABNORMAL HIGH (ref 0.76–1.27)
Globulin, Total: 2.9 g/dL (ref 1.5–4.5)
Glucose: 74 mg/dL (ref 70–99)
Potassium: 4.9 mmol/L (ref 3.5–5.2)
Sodium: 145 mmol/L — ABNORMAL HIGH (ref 134–144)
Total Protein: 8.1 g/dL (ref 6.0–8.5)
eGFR: 53 mL/min/{1.73_m2} — ABNORMAL LOW (ref >59)

## 2021-08-20 LAB — LIPID PANEL
Chol/HDL Ratio: 3.3 ratio (ref 0.0–5.0)
Cholesterol, Total: 121 mg/dL (ref 100–199)
HDL: 37 mg/dL — ABNORMAL LOW (ref >39)
LDL Chol Calc (NIH): 63 mg/dL (ref 0–99)
Triglycerides: 117 mg/dL (ref 0–149)
VLDL Cholesterol Cal: 21 mg/dL (ref 5–40)

## 2021-08-20 LAB — HEMOGLOBIN A1C
Est. average glucose Bld gHb Est-mCnc: 140 mg/dL
Hgb A1c MFr Bld: 6.5 % — ABNORMAL HIGH (ref 4.8–5.6)

## 2021-08-20 LAB — PSA: Prostate Specific Ag, Serum: 0.7 ng/mL (ref 0.0–4.0)

## 2021-08-21 ENCOUNTER — Ambulatory Visit: Payer: BC Managed Care – PPO | Admitting: Family Medicine

## 2021-08-21 DIAGNOSIS — N1831 Chronic kidney disease, stage 3a: Secondary | ICD-10-CM | POA: Diagnosis not present

## 2021-08-21 DIAGNOSIS — E785 Hyperlipidemia, unspecified: Secondary | ICD-10-CM

## 2021-08-21 DIAGNOSIS — I10 Essential (primary) hypertension: Secondary | ICD-10-CM

## 2021-08-21 DIAGNOSIS — E1122 Type 2 diabetes mellitus with diabetic chronic kidney disease: Secondary | ICD-10-CM

## 2021-08-21 NOTE — Patient Instructions (Signed)
Everything is stable.  Follow up in 6 months.  MyChart with any concerns or questions.  Take care  Dr. Lacinda Axon

## 2021-08-22 NOTE — Assessment & Plan Note (Signed)
Stable.  We will continue to monitor closely. 

## 2021-08-22 NOTE — Assessment & Plan Note (Signed)
LDL at goal.  Continue Lipitor. 

## 2021-08-22 NOTE — Assessment & Plan Note (Addendum)
Diabetes is at goal.  Continue Farxiga, metformin, glipizide.

## 2021-08-22 NOTE — Progress Notes (Signed)
Subjective:  Patient ID: Andrew Duarte, male    DOB: April 27, 1965  Age: 56 y.o. MRN: [id number]  CC: Chief Complaint  Patient presents with   Diabetes    Doing well- no problems or concerns  Discuss recent lab work    HPI:  56 year old male with hypertension, type 2 diabetes, chronic kidney disease stage III, hyperlipidemia presents for follow-up.  Hypertension is at goal.  He is doing well and is feeling well.  He is on enalapril and amlodipine.  Type 2 diabetes is well controlled.  A1c improved from previous and is currently 6.5.  He has benefited from addition of [place].  He remains on glipizide and metformin as well.  Chronic kidney disease is stable.  He follows with me and nephrology.  Additionally, patient's LDL is at goal.  Doing well on Lipitor.  Patient Active Problem List   Diagnosis Date Noted   CKD (chronic kidney disease) stage 3, GFR 30-59 ml/min (HCC) 05/23/2021   Type 2 diabetes mellitus with stage 3a chronic kidney disease, without long-term current use of insulin ([place]) 02/19/2021   Essential hypertension 02/24/2016   Hyperlipidemia 05/03/2015    Social Hx   Social History   Socioeconomic History   Marital status: Single    Spouse name: Not on file   Number of children: Not on file   Years of education: Not on file   Highest education level: Not on file  Occupational History   Not on file  Tobacco Use   Smoking status: Never   Smokeless tobacco: Never  Vaping Use   Vaping Use: Never used  Substance and Sexual Activity   Alcohol use: Not Currently    Alcohol/week: 0.0 standard drinks   Drug use: Not Currently   Sexual activity: Not on file  Other Topics Concern   Not on file  Social History Narrative   Not on file   Social Determinants of Health   Financial Resource Strain: Not on file  Food Insecurity: Not on file  Transportation Needs: Not on file  Physical Activity: Not on file  Stress: Not on file  Social Connections: Not on  file    Review of Systems  Constitutional: Negative.   Respiratory: Negative.    Cardiovascular: Negative.     Objective:  BP 130/74   Pulse 76   Ht '5\' 10"'$  (1.778 m)   Wt 213 lb 6.4 oz (96.8 kg)   BMI 30.62 kg/m      08/21/2021    2:32 PM 05/22/2021    2:19 PM 02/19/2021    2:42 PM  BP/Weight  Systolic BP 841 324 401  Diastolic BP 74 84 88  Wt. (Lbs) 213.4 211.2 216.2  BMI 30.62 kg/m2 30.3 kg/m2 31.02 kg/m2    Physical Exam Vitals and nursing note reviewed.  Constitutional:      General: He is not in acute distress.    Appearance: Normal appearance. He is not ill-appearing.  HENT:     Head: Normocephalic and atraumatic.  Eyes:     Conjunctiva/sclera: Conjunctivae normal.  Cardiovascular:     Rate and Rhythm: Normal rate and regular rhythm.  Pulmonary:     Effort: Pulmonary effort is normal.     Breath sounds: Normal breath sounds. No wheezing, rhonchi or rales.  Abdominal:     General: There is no distension.     Palpations: Abdomen is soft.     Tenderness: There is no abdominal tenderness.  Neurological:     Mental  Status: He is alert.  Psychiatric:        Mood and Affect: Mood normal.        Behavior: Behavior normal.    Lab Results  Component Value Date   WBC 9.7 08/19/2021   HGB 11.6 (L) 08/19/2021   HCT 37.7 08/19/2021   PLT 290 08/19/2021   GLUCOSE 74 08/19/2021   CHOL 121 08/19/2021   TRIG 117 08/19/2021   HDL 37 (L) 08/19/2021   LDLCALC 63 08/19/2021   ALT 26 08/19/2021   AST 27 08/19/2021   NA 145 (H) 08/19/2021   K 4.9 08/19/2021   CL 107 (H) 08/19/2021   CREATININE 1.52 (H) 08/19/2021   BUN 20 08/19/2021   CO2 19 (L) 08/19/2021   HGBA1C 6.5 (H) 08/19/2021     Assessment & Plan:   Problem List Items Addressed This Visit       Cardiovascular and Mediastinum   Essential hypertension    At goal.  Continue enalapril and amlodipine.         Endocrine   Type 2 diabetes mellitus with stage 3a chronic kidney disease, without  long-term current use of insulin ([place])    Diabetes is at goal.  Continue Farxiga, metformin, glipizide.       Relevant Medications   dapagliflozin propanediol (FARXIGA) 5 MG TABS tablet     Genitourinary   CKD (chronic kidney disease) stage 3, GFR 30-59 ml/min (HCC)    Stable.  We will continue to monitor closely.         Other   Hyperlipidemia    LDL at goal.  Continue Lipitor.       Follow-up:  Return in about 6 months (around 02/21/2022).  [place]

## 2021-08-22 NOTE — Assessment & Plan Note (Signed)
At goal.  Continue enalapril and amlodipine.

## 2021-08-26 DIAGNOSIS — E1129 Type 2 diabetes mellitus with other diabetic kidney complication: Secondary | ICD-10-CM | POA: Diagnosis not present

## 2021-08-26 DIAGNOSIS — N189 Chronic kidney disease, unspecified: Secondary | ICD-10-CM | POA: Diagnosis not present

## 2021-08-26 DIAGNOSIS — E1122 Type 2 diabetes mellitus with diabetic chronic kidney disease: Secondary | ICD-10-CM | POA: Diagnosis not present

## 2021-08-26 DIAGNOSIS — R809 Proteinuria, unspecified: Secondary | ICD-10-CM | POA: Diagnosis not present

## 2021-08-28 DIAGNOSIS — N189 Chronic kidney disease, unspecified: Secondary | ICD-10-CM | POA: Diagnosis not present

## 2021-08-28 DIAGNOSIS — D638 Anemia in other chronic diseases classified elsewhere: Secondary | ICD-10-CM | POA: Diagnosis not present

## 2021-08-28 DIAGNOSIS — I129 Hypertensive chronic kidney disease with stage 1 through stage 4 chronic kidney disease, or unspecified chronic kidney disease: Secondary | ICD-10-CM | POA: Diagnosis not present

## 2021-08-28 DIAGNOSIS — R809 Proteinuria, unspecified: Secondary | ICD-10-CM | POA: Diagnosis not present

## 2021-09-04 ENCOUNTER — Other Ambulatory Visit: Payer: Self-pay

## 2021-09-04 DIAGNOSIS — E785 Hyperlipidemia, unspecified: Secondary | ICD-10-CM

## 2021-09-04 MED ORDER — ATORVASTATIN CALCIUM 40 MG PO TABS: ORAL_TABLET | ORAL | 1 refills | Status: DC

## 2021-09-27 ENCOUNTER — Other Ambulatory Visit: Payer: Self-pay | Admitting: Family Medicine

## 2021-09-27 DIAGNOSIS — E119 Type 2 diabetes mellitus without complications: Secondary | ICD-10-CM

## 2021-10-30 ENCOUNTER — Other Ambulatory Visit: Payer: Self-pay | Admitting: Family Medicine

## 2021-10-30 DIAGNOSIS — E119 Type 2 diabetes mellitus without complications: Secondary | ICD-10-CM

## 2021-11-10 DIAGNOSIS — E1129 Type 2 diabetes mellitus with other diabetic kidney complication: Secondary | ICD-10-CM | POA: Diagnosis not present

## 2021-11-10 DIAGNOSIS — R809 Proteinuria, unspecified: Secondary | ICD-10-CM | POA: Diagnosis not present

## 2021-11-10 DIAGNOSIS — E1122 Type 2 diabetes mellitus with diabetic chronic kidney disease: Secondary | ICD-10-CM | POA: Diagnosis not present

## 2021-11-10 DIAGNOSIS — N189 Chronic kidney disease, unspecified: Secondary | ICD-10-CM | POA: Diagnosis not present

## 2021-11-13 DIAGNOSIS — D638 Anemia in other chronic diseases classified elsewhere: Secondary | ICD-10-CM | POA: Diagnosis not present

## 2021-11-13 DIAGNOSIS — I129 Hypertensive chronic kidney disease with stage 1 through stage 4 chronic kidney disease, or unspecified chronic kidney disease: Secondary | ICD-10-CM | POA: Diagnosis not present

## 2021-11-13 DIAGNOSIS — N189 Chronic kidney disease, unspecified: Secondary | ICD-10-CM | POA: Diagnosis not present

## 2021-11-13 DIAGNOSIS — E211 Secondary hyperparathyroidism, not elsewhere classified: Secondary | ICD-10-CM | POA: Diagnosis not present

## 2021-11-20 DIAGNOSIS — R809 Proteinuria, unspecified: Secondary | ICD-10-CM | POA: Diagnosis not present

## 2021-11-20 DIAGNOSIS — E1122 Type 2 diabetes mellitus with diabetic chronic kidney disease: Secondary | ICD-10-CM | POA: Diagnosis not present

## 2021-11-20 DIAGNOSIS — N189 Chronic kidney disease, unspecified: Secondary | ICD-10-CM | POA: Diagnosis not present

## 2021-11-20 DIAGNOSIS — I129 Hypertensive chronic kidney disease with stage 1 through stage 4 chronic kidney disease, or unspecified chronic kidney disease: Secondary | ICD-10-CM | POA: Diagnosis not present

## 2021-12-23 ENCOUNTER — Other Ambulatory Visit: Payer: Self-pay | Admitting: Family Medicine

## 2022-01-13 DIAGNOSIS — N189 Chronic kidney disease, unspecified: Secondary | ICD-10-CM | POA: Diagnosis not present

## 2022-01-13 DIAGNOSIS — E211 Secondary hyperparathyroidism, not elsewhere classified: Secondary | ICD-10-CM | POA: Diagnosis not present

## 2022-01-13 DIAGNOSIS — I129 Hypertensive chronic kidney disease with stage 1 through stage 4 chronic kidney disease, or unspecified chronic kidney disease: Secondary | ICD-10-CM | POA: Diagnosis not present

## 2022-01-13 DIAGNOSIS — E1122 Type 2 diabetes mellitus with diabetic chronic kidney disease: Secondary | ICD-10-CM | POA: Diagnosis not present

## 2022-01-15 DIAGNOSIS — N189 Chronic kidney disease, unspecified: Secondary | ICD-10-CM | POA: Diagnosis not present

## 2022-01-15 DIAGNOSIS — R809 Proteinuria, unspecified: Secondary | ICD-10-CM | POA: Diagnosis not present

## 2022-01-15 DIAGNOSIS — I129 Hypertensive chronic kidney disease with stage 1 through stage 4 chronic kidney disease, or unspecified chronic kidney disease: Secondary | ICD-10-CM | POA: Diagnosis not present

## 2022-01-15 DIAGNOSIS — E211 Secondary hyperparathyroidism, not elsewhere classified: Secondary | ICD-10-CM | POA: Diagnosis not present

## 2022-01-30 DIAGNOSIS — R809 Proteinuria, unspecified: Secondary | ICD-10-CM | POA: Diagnosis not present

## 2022-01-30 DIAGNOSIS — N189 Chronic kidney disease, unspecified: Secondary | ICD-10-CM | POA: Diagnosis not present

## 2022-01-30 DIAGNOSIS — E1122 Type 2 diabetes mellitus with diabetic chronic kidney disease: Secondary | ICD-10-CM | POA: Diagnosis not present

## 2022-01-30 DIAGNOSIS — I129 Hypertensive chronic kidney disease with stage 1 through stage 4 chronic kidney disease, or unspecified chronic kidney disease: Secondary | ICD-10-CM | POA: Diagnosis not present

## 2022-02-23 ENCOUNTER — Ambulatory Visit: Payer: BC Managed Care – PPO | Admitting: Family Medicine

## 2022-02-23 VITALS — BP 132/80 | HR 86 | Temp 98.4°F | Ht 70.0 in | Wt 216.6 lb

## 2022-02-23 DIAGNOSIS — N1831 Chronic kidney disease, stage 3a: Secondary | ICD-10-CM | POA: Diagnosis not present

## 2022-02-23 DIAGNOSIS — E785 Hyperlipidemia, unspecified: Secondary | ICD-10-CM

## 2022-02-23 DIAGNOSIS — E1122 Type 2 diabetes mellitus with diabetic chronic kidney disease: Secondary | ICD-10-CM

## 2022-02-23 DIAGNOSIS — I10 Essential (primary) hypertension: Secondary | ICD-10-CM | POA: Diagnosis not present

## 2022-02-23 MED ORDER — KERENDIA 10 MG PO TABS: 10.0000 mg | ORAL_TABLET | Freq: Every day | ORAL | 6 refills | Status: DC

## 2022-02-23 NOTE — Patient Instructions (Signed)
I sent in the [place] (we will see).  Labs today.  Follow up in 6 months.

## 2022-02-24 NOTE — Assessment & Plan Note (Signed)
Lipid panel today.  Continue atorvastatin.

## 2022-02-24 NOTE — Assessment & Plan Note (Signed)
Well-controlled.  Continue enalapril and amlodipine.

## 2022-02-24 NOTE — Assessment & Plan Note (Signed)
Has been well-controlled.  Continue metformin and Farxiga. A1C today.

## 2022-02-24 NOTE — Progress Notes (Signed)
Subjective:  Patient ID: Andrew Duarte, male    DOB: 1965-06-20  Age: 56 y.o. MRN: [id number]  CC: Chief Complaint  Patient presents with   Follow-up    Follow up on medications. Patient states he is doing well with his medications.    HPI:  56 year old male with type 2 diabetes, hypertension, hyperlipidemia, chronic kidney disease presents for follow-up.  A1c has been at goal.  He is on glipizide, Farxiga, and metformin.  His renal function is being monitored closely by nephrology.  For now, does not need to alter dose of metformin.  Needs A1c.  Patient is overdue for eye exam.  We discussed this today.  Hypertension well-controlled on amlodipine and enalapril.  CKD is monitored by nephrology.  Note reflects that he was started on [place] but he states that he has not yet on this medication.  I will send this in to see if we can get him started on this.  It is indicated due to chronic kidney disease and persistent proteinuria despite ACE and SGLT2.  Hyperlipidemia has been well-controlled on atorvastatin.  Needs lipid panel.  Patient Active Problem List   Diagnosis Date Noted   CKD (chronic kidney disease) stage 3, GFR 30-59 ml/min (HCC) 05/23/2021   Type 2 diabetes mellitus with stage 3a chronic kidney disease, without long-term current use of insulin ([place]) 02/19/2021   Essential hypertension 02/24/2016   Hyperlipidemia 05/03/2015    Social Hx   Social History   Socioeconomic History   Marital status: Single    Spouse name: Not on file   Number of children: Not on file   Years of education: Not on file   Highest education level: Not on file  Occupational History   Not on file  Tobacco Use   Smoking status: Never   Smokeless tobacco: Never  Vaping Use   Vaping Use: Never used  Substance and Sexual Activity   Alcohol use: Not Currently    Alcohol/week: 0.0 standard drinks of alcohol   Drug use: Not Currently   Sexual activity: Not on file  Other Topics Concern    Not on file  Social History Narrative   Not on file   Social Determinants of Health   Financial Resource Strain: Not on file  Food Insecurity: Not on file  Transportation Needs: Not on file  Physical Activity: Not on file  Stress: Not on file  Social Connections: Not on file    Review of Systems  Constitutional: Negative.   Respiratory: Negative.    Cardiovascular: Negative.     Objective:  BP 132/80   Pulse 86   Temp 98.4 F (36.9 C) (Oral)   Ht '5\' 10"'$  (1.778 m)   Wt 216 lb 9.6 oz (98.2 kg)   SpO2 97%   BMI 31.08 kg/m      02/23/2022    2:37 PM 08/21/2021    2:32 PM 05/22/2021    2:19 PM  BP/Weight  Systolic BP 017 793 903  Diastolic BP 80 74 84  Wt. (Lbs) 216.6 213.4 211.2  BMI 31.08 kg/m2 30.62 kg/m2 30.3 kg/m2    Physical Exam Vitals and nursing note reviewed.  Constitutional:      General: He is not in acute distress.    Appearance: Normal appearance.  HENT:     Head: Normocephalic and atraumatic.  Eyes:     General:        Right eye: No discharge.        Left eye:  No discharge.     Conjunctiva/sclera: Conjunctivae normal.  Cardiovascular:     Rate and Rhythm: Normal rate and regular rhythm.  Pulmonary:     Effort: Pulmonary effort is normal.     Breath sounds: Normal breath sounds. No wheezing, rhonchi or rales.  Neurological:     Mental Status: He is alert.  Psychiatric:        Mood and Affect: Mood normal.        Behavior: Behavior normal.     Lab Results  Component Value Date   WBC 9.7 08/19/2021   HGB 11.6 (L) 08/19/2021   HCT 37.7 08/19/2021   PLT 290 08/19/2021   GLUCOSE 74 08/19/2021   CHOL 121 08/19/2021   TRIG 117 08/19/2021   HDL 37 (L) 08/19/2021   LDLCALC 63 08/19/2021   ALT 26 08/19/2021   AST 27 08/19/2021   NA 145 (H) 08/19/2021   K 4.9 08/19/2021   CL 107 (H) 08/19/2021   CREATININE 1.52 (H) 08/19/2021   BUN 20 08/19/2021   CO2 19 (L) 08/19/2021   HGBA1C 6.5 (H) 08/19/2021     Assessment & Plan:    Problem List Items Addressed This Visit       Cardiovascular and Mediastinum   Essential hypertension    Well-controlled.  Continue enalapril and amlodipine.        Endocrine   Type 2 diabetes mellitus with stage 3a chronic kidney disease, without long-term current use of insulin ([place]) - Primary    Has been well-controlled.  Continue metformin and Farxiga. A1C today.       Relevant Orders   Hemoglobin A1c     Genitourinary   CKD (chronic kidney disease) stage 3, GFR 30-59 ml/min (HCC)    Rx sent for [place].         Other   Hyperlipidemia    Lipid panel today.  Continue atorvastatin.      Relevant Orders   Lipid panel    Meds ordered this encounter  Medications   Finerenone (KERENDIA) 10 MG TABS    Sig: Take 10 mg by mouth daily.    Dispense:  30 tablet    Refill:  6    Follow-up: 6 months  [place]

## 2022-02-24 NOTE — Assessment & Plan Note (Signed)
Rx sent for [place].

## 2022-03-16 DIAGNOSIS — I129 Hypertensive chronic kidney disease with stage 1 through stage 4 chronic kidney disease, or unspecified chronic kidney disease: Secondary | ICD-10-CM | POA: Diagnosis not present

## 2022-03-16 DIAGNOSIS — E1129 Type 2 diabetes mellitus with other diabetic kidney complication: Secondary | ICD-10-CM | POA: Diagnosis not present

## 2022-03-16 DIAGNOSIS — E1122 Type 2 diabetes mellitus with diabetic chronic kidney disease: Secondary | ICD-10-CM | POA: Diagnosis not present

## 2022-03-16 DIAGNOSIS — N189 Chronic kidney disease, unspecified: Secondary | ICD-10-CM | POA: Diagnosis not present

## 2022-03-19 DIAGNOSIS — I129 Hypertensive chronic kidney disease with stage 1 through stage 4 chronic kidney disease, or unspecified chronic kidney disease: Secondary | ICD-10-CM | POA: Diagnosis not present

## 2022-03-19 DIAGNOSIS — R809 Proteinuria, unspecified: Secondary | ICD-10-CM | POA: Diagnosis not present

## 2022-03-19 DIAGNOSIS — N189 Chronic kidney disease, unspecified: Secondary | ICD-10-CM | POA: Diagnosis not present

## 2022-03-19 DIAGNOSIS — E211 Secondary hyperparathyroidism, not elsewhere classified: Secondary | ICD-10-CM | POA: Diagnosis not present

## 2022-03-28 ENCOUNTER — Other Ambulatory Visit: Payer: Self-pay | Admitting: Family Medicine

## 2022-03-28 DIAGNOSIS — E119 Type 2 diabetes mellitus without complications: Secondary | ICD-10-CM

## 2022-04-15 DIAGNOSIS — R809 Proteinuria, unspecified: Secondary | ICD-10-CM | POA: Diagnosis not present

## 2022-04-15 DIAGNOSIS — N189 Chronic kidney disease, unspecified: Secondary | ICD-10-CM | POA: Diagnosis not present

## 2022-04-15 DIAGNOSIS — E1122 Type 2 diabetes mellitus with diabetic chronic kidney disease: Secondary | ICD-10-CM | POA: Diagnosis not present

## 2022-04-15 DIAGNOSIS — E1129 Type 2 diabetes mellitus with other diabetic kidney complication: Secondary | ICD-10-CM | POA: Diagnosis not present

## 2022-05-06 ENCOUNTER — Other Ambulatory Visit: Payer: Self-pay | Admitting: Family Medicine

## 2022-05-06 DIAGNOSIS — E785 Hyperlipidemia, unspecified: Secondary | ICD-10-CM

## 2022-05-15 DIAGNOSIS — I129 Hypertensive chronic kidney disease with stage 1 through stage 4 chronic kidney disease, or unspecified chronic kidney disease: Secondary | ICD-10-CM | POA: Diagnosis not present

## 2022-05-15 DIAGNOSIS — N170 Acute kidney failure with tubular necrosis: Secondary | ICD-10-CM | POA: Diagnosis not present

## 2022-05-15 DIAGNOSIS — E211 Secondary hyperparathyroidism, not elsewhere classified: Secondary | ICD-10-CM | POA: Diagnosis not present

## 2022-05-15 DIAGNOSIS — N189 Chronic kidney disease, unspecified: Secondary | ICD-10-CM | POA: Diagnosis not present

## 2022-06-02 DIAGNOSIS — N189 Chronic kidney disease, unspecified: Secondary | ICD-10-CM | POA: Diagnosis not present

## 2022-06-02 DIAGNOSIS — I129 Hypertensive chronic kidney disease with stage 1 through stage 4 chronic kidney disease, or unspecified chronic kidney disease: Secondary | ICD-10-CM | POA: Diagnosis not present

## 2022-06-02 DIAGNOSIS — N170 Acute kidney failure with tubular necrosis: Secondary | ICD-10-CM | POA: Diagnosis not present

## 2022-06-02 DIAGNOSIS — E1122 Type 2 diabetes mellitus with diabetic chronic kidney disease: Secondary | ICD-10-CM | POA: Diagnosis not present

## 2022-06-08 ENCOUNTER — Other Ambulatory Visit: Payer: Self-pay | Admitting: Family Medicine

## 2022-06-08 ENCOUNTER — Telehealth: Payer: Self-pay | Admitting: Family Medicine

## 2022-06-08 DIAGNOSIS — E785 Hyperlipidemia, unspecified: Secondary | ICD-10-CM

## 2022-06-08 MED ORDER — ATORVASTATIN CALCIUM 40 MG PO TABS: ORAL_TABLET | ORAL | 1 refills | Status: DC

## 2022-06-08 NOTE — Telephone Encounter (Signed)
Patient is requesting refill on atorvastatin 40 mg he has 1 refill left on [facility]

## 2022-08-11 DIAGNOSIS — I129 Hypertensive chronic kidney disease with stage 1 through stage 4 chronic kidney disease, or unspecified chronic kidney disease: Secondary | ICD-10-CM | POA: Diagnosis not present

## 2022-08-11 DIAGNOSIS — N170 Acute kidney failure with tubular necrosis: Secondary | ICD-10-CM | POA: Diagnosis not present

## 2022-08-11 DIAGNOSIS — E1122 Type 2 diabetes mellitus with diabetic chronic kidney disease: Secondary | ICD-10-CM | POA: Diagnosis not present

## 2022-08-11 DIAGNOSIS — N189 Chronic kidney disease, unspecified: Secondary | ICD-10-CM | POA: Diagnosis not present

## 2022-08-11 DIAGNOSIS — R809 Proteinuria, unspecified: Secondary | ICD-10-CM | POA: Diagnosis not present

## 2022-08-14 DIAGNOSIS — I129 Hypertensive chronic kidney disease with stage 1 through stage 4 chronic kidney disease, or unspecified chronic kidney disease: Secondary | ICD-10-CM | POA: Diagnosis not present

## 2022-08-14 DIAGNOSIS — N2581 Secondary hyperparathyroidism of renal origin: Secondary | ICD-10-CM | POA: Diagnosis not present

## 2022-08-14 DIAGNOSIS — E559 Vitamin D deficiency, unspecified: Secondary | ICD-10-CM | POA: Diagnosis not present

## 2022-08-14 DIAGNOSIS — D638 Anemia in other chronic diseases classified elsewhere: Secondary | ICD-10-CM | POA: Diagnosis not present

## 2022-08-24 ENCOUNTER — Encounter: Payer: Self-pay | Admitting: Family Medicine

## 2022-08-24 ENCOUNTER — Ambulatory Visit: Payer: BC Managed Care – PPO | Admitting: Family Medicine

## 2022-08-24 VITALS — BP 129/78 | HR 93 | Ht 71.0 in | Wt 216.0 lb

## 2022-08-24 DIAGNOSIS — E785 Hyperlipidemia, unspecified: Secondary | ICD-10-CM | POA: Diagnosis not present

## 2022-08-24 DIAGNOSIS — Z125 Encounter for screening for malignant neoplasm of prostate: Secondary | ICD-10-CM | POA: Diagnosis not present

## 2022-08-24 DIAGNOSIS — I10 Essential (primary) hypertension: Secondary | ICD-10-CM | POA: Diagnosis not present

## 2022-08-24 DIAGNOSIS — Z7984 Long term (current) use of oral hypoglycemic drugs: Secondary | ICD-10-CM

## 2022-08-24 DIAGNOSIS — E1122 Type 2 diabetes mellitus with diabetic chronic kidney disease: Secondary | ICD-10-CM

## 2022-08-24 DIAGNOSIS — N1832 Chronic kidney disease, stage 3b: Secondary | ICD-10-CM

## 2022-08-24 DIAGNOSIS — N1831 Chronic kidney disease, stage 3a: Secondary | ICD-10-CM

## 2022-08-24 NOTE — Patient Instructions (Addendum)
Labs when you can.  Follow up in 6 months.  Take care  Dr. Schylar Allard 

## 2022-08-25 NOTE — Assessment & Plan Note (Signed)
Stable.  Continue amlodipine and enalapril. 

## 2022-08-25 NOTE — Assessment & Plan Note (Signed)
Continue close follow-up with nephrology. 

## 2022-08-25 NOTE — Progress Notes (Signed)
Subjective:  Patient ID: Andrew Duarte, male    DOB: [birth year]  Age: 57 y.o. MRN: [id number]  CC: Chief Complaint  Patient presents with   Diabetes    Six month follow up    HPI:  57 year old male with hypertension, type 2 diabetes, chronic kidney disease, hyperlipidemia presents for follow-up.  Patient states that he is feeling very well.  Denies chest pain or shortness of breath.  Metformin has been discontinued by nephrology due to renal function.  He remains on glipizide.  He also remains on Farxiga although he is taking it every other day as directed by nephrology.  Needs A1c.  Patient has an upcoming eye exam in June.  Hypertension is well-controlled on amlodipine and enalapril.  Patient's renal function is being followed closely by nephrology.  He has associated microcytic anemia.  Most recent creatinine 1.83.  Patient Active Problem List   Diagnosis Date Noted   CKD stage 3b, GFR 30-44 ml/min (HCC) 05/23/2021   Type 2 diabetes mellitus with stage 3a chronic kidney disease, without long-term current use of insulin (HCC) 02/19/2021   Essential hypertension 02/24/2016   Hyperlipidemia 05/03/2015    Social Hx   Social History   Socioeconomic History   Marital status: Single    Spouse name: Not on file   Number of children: Not on file   Years of education: Not on file   Highest education level: Not on file  Occupational History   Not on file  Tobacco Use   Smoking status: Never   Smokeless tobacco: Never  Vaping Use   Vaping Use: Never used  Substance and Sexual Activity   Alcohol use: Not Currently    Alcohol/week: 0.0 standard drinks of alcohol   Drug use: Not Currently   Sexual activity: Not on file  Other Topics Concern   Not on file  Social History Narrative   Not on file   Social Determinants of Health   Financial Resource Strain: Not on file  Food Insecurity: Not on file  Transportation Needs: Not on file  Physical Activity: Not on file   Stress: Not on file  Social Connections: Not on file    Review of Systems Per HPI  Objective:  BP 129/78 (BP Location: Right Arm, Patient Position: Sitting, Cuff Size: Large)   Pulse 93   Ht 5\' 11"  (1.803 m)   Wt 216 lb (98 kg)   SpO2 99%   BMI 30.13 kg/m      08/24/2022    2:38 PM 02/23/2022    2:37 PM 08/21/2021    2:32 PM  BP/Weight  Systolic BP 129 132 130  Diastolic BP 78 80 74  Wt. (Lbs) 216 216.6 213.4  BMI 30.13 kg/m2 31.08 kg/m2 30.62 kg/m2    Physical Exam Vitals and nursing note reviewed.  Constitutional:      General: He is not in acute distress.    Appearance: Normal appearance.  HENT:     Head: Normocephalic and atraumatic.  Eyes:     General:        Right eye: No discharge.        Left eye: No discharge.     Conjunctiva/sclera: Conjunctivae normal.  Cardiovascular:     Rate and Rhythm: Normal rate and regular rhythm.  Pulmonary:     Effort: Pulmonary effort is normal.     Breath sounds: Normal breath sounds. No wheezing, rhonchi or rales.  Neurological:     Mental Status: He is alert.  Psychiatric:        Mood and Affect: Mood normal.        Behavior: Behavior normal.     Lab Results  Component Value Date   WBC 9.7 08/19/2021   HGB 11.6 (L) 08/19/2021   HCT 37.7 08/19/2021   PLT 290 08/19/2021   GLUCOSE 74 08/19/2021   CHOL 121 08/19/2021   TRIG 117 08/19/2021   HDL 37 (L) 08/19/2021   LDLCALC 63 08/19/2021   ALT 26 08/19/2021   AST 27 08/19/2021   NA 145 (H) 08/19/2021   K 4.9 08/19/2021   CL 107 (H) 08/19/2021   CREATININE 1.52 (H) 08/19/2021   BUN 20 08/19/2021   CO2 19 (L) 08/19/2021   HGBA1C 6.5 (H) 08/19/2021     Assessment & Plan:   Problem List Items Addressed This Visit       Cardiovascular and Mediastinum   Essential hypertension - Primary    Stable.  Continue amlodipine and enalapril.        Endocrine   Type 2 diabetes mellitus with stage 3a chronic kidney disease, without long-term current use of  insulin (HCC)    Unsure current control.  Most recent labs from nephrology revealed that his glucose has been elevated.  I suspect that this is due to discontinuation of metformin.  I suspect that he will need additional pharmacotherapy.  Awaiting A1c.  Continue glipizide and Farxiga.      Relevant Orders   Hemoglobin A1c     Genitourinary   CKD stage 3b, GFR 30-44 ml/min (HCC)    Continue close follow-up with nephrology.        Other   Hyperlipidemia    LDL at goal.  Lipid panel today.  Continue Lipitor.      Relevant Orders   Lipid panel   Other Visit Diagnoses     Prostate cancer screening       Relevant Orders   PSA       Follow-up:  6 months (pending labs)  Everlene Other DO [facility]

## 2022-08-25 NOTE — Assessment & Plan Note (Signed)
LDL at goal.  Lipid panel today.  Continue Lipitor.

## 2022-08-25 NOTE — Assessment & Plan Note (Addendum)
Unsure current control.  Most recent labs from nephrology revealed that his glucose has been elevated.  I suspect that this is due to discontinuation of metformin.  I suspect that he will need additional pharmacotherapy.  Awaiting A1c.  Continue glipizide and Farxiga.

## 2022-09-28 ENCOUNTER — Other Ambulatory Visit: Payer: Self-pay | Admitting: Family Medicine

## 2022-09-28 DIAGNOSIS — E119 Type 2 diabetes mellitus without complications: Secondary | ICD-10-CM

## 2022-11-16 DIAGNOSIS — R809 Proteinuria, unspecified: Secondary | ICD-10-CM | POA: Diagnosis not present

## 2022-11-16 DIAGNOSIS — N183 Chronic kidney disease, stage 3 unspecified: Secondary | ICD-10-CM | POA: Diagnosis not present

## 2022-11-16 DIAGNOSIS — D631 Anemia in chronic kidney disease: Secondary | ICD-10-CM | POA: Diagnosis not present

## 2022-12-02 DIAGNOSIS — H401132 Primary open-angle glaucoma, bilateral, moderate stage: Secondary | ICD-10-CM | POA: Diagnosis not present

## 2022-12-14 LAB — HM DIABETES EYE EXAM

## 2022-12-25 ENCOUNTER — Other Ambulatory Visit: Payer: Self-pay | Admitting: Family Medicine

## 2022-12-28 ENCOUNTER — Other Ambulatory Visit: Payer: Self-pay | Admitting: Family Medicine

## 2022-12-28 DIAGNOSIS — E119 Type 2 diabetes mellitus without complications: Secondary | ICD-10-CM

## 2023-01-04 ENCOUNTER — Encounter: Payer: Self-pay | Admitting: *Deleted

## 2023-02-19 DIAGNOSIS — N1832 Chronic kidney disease, stage 3b: Secondary | ICD-10-CM | POA: Diagnosis not present

## 2023-02-19 DIAGNOSIS — E785 Hyperlipidemia, unspecified: Secondary | ICD-10-CM | POA: Diagnosis not present

## 2023-02-19 DIAGNOSIS — E1122 Type 2 diabetes mellitus with diabetic chronic kidney disease: Secondary | ICD-10-CM | POA: Diagnosis not present

## 2023-02-19 DIAGNOSIS — N1831 Chronic kidney disease, stage 3a: Secondary | ICD-10-CM | POA: Diagnosis not present

## 2023-02-19 DIAGNOSIS — R809 Proteinuria, unspecified: Secondary | ICD-10-CM | POA: Diagnosis not present

## 2023-02-19 DIAGNOSIS — E1129 Type 2 diabetes mellitus with other diabetic kidney complication: Secondary | ICD-10-CM | POA: Diagnosis not present

## 2023-02-19 DIAGNOSIS — Z125 Encounter for screening for malignant neoplasm of prostate: Secondary | ICD-10-CM | POA: Diagnosis not present

## 2023-02-20 LAB — LIPID PANEL
Chol/HDL Ratio: 3.9 {ratio} (ref 0.0–5.0)
Cholesterol, Total: 128 mg/dL (ref 100–199)
HDL: 33 mg/dL — ABNORMAL LOW (ref >39)
LDL Chol Calc (NIH): 69 mg/dL (ref 0–99)
Triglycerides: 148 mg/dL (ref 0–149)
VLDL Cholesterol Cal: 26 mg/dL (ref 5–40)

## 2023-02-20 LAB — HEMOGLOBIN A1C
Est. average glucose Bld gHb Est-mCnc: 266 mg/dL
Hgb A1c MFr Bld: 10.9 % — ABNORMAL HIGH (ref 4.8–5.6)

## 2023-02-20 LAB — PSA: Prostate Specific Ag, Serum: 0.7 ng/mL (ref 0.0–4.0)

## 2023-02-22 ENCOUNTER — Other Ambulatory Visit: Payer: Self-pay | Admitting: Family Medicine

## 2023-02-22 DIAGNOSIS — E785 Hyperlipidemia, unspecified: Secondary | ICD-10-CM

## 2023-02-25 ENCOUNTER — Encounter: Payer: Self-pay | Admitting: Family Medicine

## 2023-02-25 ENCOUNTER — Ambulatory Visit: Payer: BC Managed Care – PPO | Admitting: Family Medicine

## 2023-02-25 VITALS — BP 119/73 | HR 106 | Temp 98.2°F | Ht 71.0 in | Wt 222.0 lb

## 2023-02-25 DIAGNOSIS — N1832 Chronic kidney disease, stage 3b: Secondary | ICD-10-CM | POA: Diagnosis not present

## 2023-02-25 DIAGNOSIS — E785 Hyperlipidemia, unspecified: Secondary | ICD-10-CM

## 2023-02-25 DIAGNOSIS — I10 Essential (primary) hypertension: Secondary | ICD-10-CM | POA: Diagnosis not present

## 2023-02-25 DIAGNOSIS — N1831 Chronic kidney disease, stage 3a: Secondary | ICD-10-CM

## 2023-02-25 DIAGNOSIS — E1122 Type 2 diabetes mellitus with diabetic chronic kidney disease: Secondary | ICD-10-CM

## 2023-02-25 MED ORDER — DAPAGLIFLOZIN PROPANEDIOL 10 MG PO TABS: 10.0000 mg | ORAL_TABLET | Freq: Every day | ORAL | 1 refills | Status: DC

## 2023-02-25 NOTE — Patient Instructions (Signed)
Farxiga dose increased.  Take every day.  Lab in 7 to 10 days.  Take care  Dr. Adriana Simas

## 2023-02-26 ENCOUNTER — Other Ambulatory Visit: Payer: Self-pay

## 2023-02-26 DIAGNOSIS — N1831 Chronic kidney disease, stage 3a: Secondary | ICD-10-CM

## 2023-02-26 LAB — BASIC METABOLIC PANEL
BUN/Creatinine Ratio: 11 (ref 9–20)
BUN: 20 mg/dL (ref 6–24)
CO2: 21 mmol/L (ref 20–29)
Calcium: 9.6 mg/dL (ref 8.7–10.2)
Chloride: 105 mmol/L (ref 96–106)
Creatinine, Ser: 1.77 mg/dL — ABNORMAL HIGH (ref 0.76–1.27)
Glucose: 192 mg/dL — ABNORMAL HIGH (ref 70–99)
Potassium: 4.3 mmol/L (ref 3.5–5.2)
Sodium: 142 mmol/L (ref 134–144)
eGFR: 44 mL/min/{1.73_m2} — ABNORMAL LOW (ref >59)

## 2023-02-26 NOTE — Assessment & Plan Note (Signed)
At goal on Lipitor. Continue.

## 2023-02-26 NOTE — Assessment & Plan Note (Signed)
Stable.  Farxiga increased.

## 2023-02-26 NOTE — Assessment & Plan Note (Signed)
Stable.  Continue amlodipine and enalapril.

## 2023-02-26 NOTE — Assessment & Plan Note (Signed)
Uncontrolled/worsening.  A1c now 10.9.  He is renal function allows for normal dose of [place].  Placing on 10 mg once daily.  Patient will be more vigilant regarding his diet.

## 2023-02-26 NOTE — Progress Notes (Signed)
Subjective:  Patient ID: Andrew Duarte, male    DOB: 21-Oct-1965  Age: 57 y.o. MRN: [id number]  CC:   Chief Complaint  Patient presents with   Hypertension   Diabetes    Fbs 117    HPI:  57 year old male with hypertension, type 2 diabetes, CKD, hyperlipidemia presents for follow-up.  Patient is most recent A1c has risen significantly to 10.9.  Patient reports dietary indiscretion.  He also feels like his sugar elevated after he was taken off metformin.  He is currently on glipizide 5 mg daily and Farxiga 5 mg every other day.  The dosing were Marcelline Deist appears to be not adequate and was done this way due to prior AKI.  Will discuss this today.  Lipids at goal on Lipitor.  Hypertension at goal on amlodipine and enalapril.  States that he is feeling well.  No significant weight loss.  No chest pain or shortness of breath.   Patient Active Problem List   Diagnosis Date Noted   CKD stage 3b, GFR 30-44 ml/min (HCC) 05/23/2021   Type 2 diabetes mellitus with stage 3a chronic kidney disease, without long-term current use of insulin (HCC) 02/19/2021   Essential hypertension 02/24/2016   Hyperlipidemia 05/03/2015    Social Hx   Social History   Socioeconomic History   Marital status: Single    Spouse name: Not on file   Number of children: Not on file   Years of education: Not on file   Highest education level: Not on file  Occupational History   Not on file  Tobacco Use   Smoking status: Never   Smokeless tobacco: Never  Vaping Use   Vaping status: Never Used  Substance and Sexual Activity   Alcohol use: Not Currently    Alcohol/week: 0.0 standard drinks of alcohol   Drug use: Not Currently   Sexual activity: Not on file  Other Topics Concern   Not on file  Social History Narrative   Not on file   Social Determinants of Health   Financial Resource Strain: Not on file  Food Insecurity: Not on file  Transportation Needs: Not on file  Physical Activity: Not on  file  Stress: Not on file  Social Connections: Not on file    Review of Systems Per HPI  Objective:  BP 119/73   Pulse (!) 106   Temp 98.2 F (36.8 C)   Ht 5\' 11"  (1.803 m)   Wt 222 lb (100.7 kg)   SpO2 99%   BMI 30.96 kg/m      02/25/2023    3:01 PM 08/24/2022    2:38 PM 02/23/2022    2:37 PM  BP/Weight  Systolic BP 119 129 132  Diastolic BP 73 78 80  Wt. (Lbs) 222 216 216.6  BMI 30.96 kg/m2 30.13 kg/m2 31.08 kg/m2    Physical Exam Vitals and nursing note reviewed.  Constitutional:      General: He is not in acute distress.    Appearance: Normal appearance.  HENT:     Head: Normocephalic and atraumatic.  Eyes:     General:        Right eye: No discharge.        Left eye: No discharge.     Conjunctiva/sclera: Conjunctivae normal.  Cardiovascular:     Rate and Rhythm: Normal rate and regular rhythm.  Pulmonary:     Effort: Pulmonary effort is normal.     Breath sounds: Normal breath sounds. No wheezing, rhonchi or  rales.  Neurological:     Mental Status: He is alert.  Psychiatric:        Mood and Affect: Mood normal.        Behavior: Behavior normal.     Lab Results  Component Value Date   WBC 9.7 08/19/2021   HGB 11.6 (L) 08/19/2021   HCT 37.7 08/19/2021   PLT 290 08/19/2021   GLUCOSE 192 (H) 02/25/2023   CHOL 128 02/19/2023   TRIG 148 02/19/2023   HDL 33 (L) 02/19/2023   LDLCALC 69 02/19/2023   ALT 26 08/19/2021   AST 27 08/19/2021   NA 142 02/25/2023   K 4.3 02/25/2023   CL 105 02/25/2023   CREATININE 1.77 (H) 02/25/2023   BUN 20 02/25/2023   CO2 21 02/25/2023   HGBA1C 10.9 (H) 02/19/2023     Assessment & Plan:   Problem List Items Addressed This Visit       Cardiovascular and Mediastinum   Essential hypertension    Stable.  Continue amlodipine and enalapril.        Endocrine   Type 2 diabetes mellitus with stage 3a chronic kidney disease, without long-term current use of insulin (HCC) - Primary    Uncontrolled/worsening.   A1c now 10.9.  He is renal function allows for normal dose of [place].  Placing on 10 mg once daily.  Patient will be more vigilant regarding his diet.      Relevant Medications   dapagliflozin propanediol (FARXIGA) 10 MG TABS tablet     Genitourinary   CKD stage 3b, GFR 30-44 ml/min (HCC)    Stable.  Farxiga increased.      Relevant Orders   Basic Metabolic Panel (Completed)     Other   Hyperlipidemia    At goal on Lipitor.  Continue.       Meds ordered this encounter  Medications   dapagliflozin propanediol (FARXIGA) 10 MG TABS tablet    Sig: Take 1 tablet (10 mg total) by mouth daily before breakfast.    Dispense:  90 tablet    Refill:  1    Follow-up: 1 month  Korryn Pancoast DO [facility]

## 2023-03-01 DIAGNOSIS — I129 Hypertensive chronic kidney disease with stage 1 through stage 4 chronic kidney disease, or unspecified chronic kidney disease: Secondary | ICD-10-CM | POA: Diagnosis not present

## 2023-03-01 DIAGNOSIS — D638 Anemia in other chronic diseases classified elsewhere: Secondary | ICD-10-CM | POA: Diagnosis not present

## 2023-03-01 DIAGNOSIS — N2581 Secondary hyperparathyroidism of renal origin: Secondary | ICD-10-CM | POA: Diagnosis not present

## 2023-03-01 DIAGNOSIS — E559 Vitamin D deficiency, unspecified: Secondary | ICD-10-CM | POA: Diagnosis not present

## 2023-03-26 ENCOUNTER — Ambulatory Visit: Payer: BC Managed Care – PPO | Admitting: Family Medicine

## 2023-03-26 VITALS — BP 118/68 | HR 88 | Temp 96.6°F | Ht 71.0 in | Wt 214.0 lb

## 2023-03-26 DIAGNOSIS — N1831 Chronic kidney disease, stage 3a: Secondary | ICD-10-CM

## 2023-03-26 DIAGNOSIS — E1122 Type 2 diabetes mellitus with diabetic chronic kidney disease: Secondary | ICD-10-CM | POA: Diagnosis not present

## 2023-03-26 DIAGNOSIS — Z7984 Long term (current) use of oral hypoglycemic drugs: Secondary | ICD-10-CM

## 2023-03-26 NOTE — Patient Instructions (Signed)
Follow up in 2 months.  Take care  Dr. Adriana Simas

## 2023-03-29 NOTE — Assessment & Plan Note (Signed)
Glycemic control improving following lifestyle changes.  Continue glipizide and Farxiga.  Follow-up in 2 months.

## 2023-03-29 NOTE — Progress Notes (Signed)
Subjective:  Patient ID: Andrew Duarte, male    DOB: May 24, 1965  Age: 57 y.o. MRN: [id number]  CC:   Chief Complaint  Patient presents with   Diabetes    1 month follow up    HPI:  57 year old male presents for follow up regarding DM.  Blood sugar readings have improved following change in diet.  Blood sugar was 104 this morning.  Patient states that he has been scared straight.  He continues on glipizide and [place].  No hypoglycemia.  Patient Active Problem List   Diagnosis Date Noted   CKD stage 3b, GFR 30-44 ml/min (HCC) 05/23/2021   Type 2 diabetes mellitus with stage 3a chronic kidney disease, without long-term current use of insulin (HCC) 02/19/2021   Essential hypertension 02/24/2016   Hyperlipidemia 05/03/2015    Social Hx   Social History   Socioeconomic History   Marital status: Single    Spouse name: Not on file   Number of children: Not on file   Years of education: Not on file   Highest education level: Not on file  Occupational History   Not on file  Tobacco Use   Smoking status: Never   Smokeless tobacco: Never  Vaping Use   Vaping status: Never Used  Substance and Sexual Activity   Alcohol use: Not Currently    Alcohol/week: 0.0 standard drinks of alcohol   Drug use: Not Currently   Sexual activity: Not on file  Other Topics Concern   Not on file  Social History Narrative   Not on file   Social Drivers of Health   Financial Resource Strain: Not on file  Food Insecurity: Not on file  Transportation Needs: Not on file  Physical Activity: Not on file  Stress: Not on file  Social Connections: Not on file    Review of Systems Per HPI  Objective:  BP 118/68   Pulse 88   Temp (!) 96.6 F (35.9 C)   Ht 5\' 11"  (1.803 m)   Wt 214 lb (97.1 kg)   SpO2 100%   BMI 29.85 kg/m      03/26/2023    3:03 PM 02/25/2023    3:01 PM 08/24/2022    2:38 PM  BP/Weight  Systolic BP 118 119 129  Diastolic BP 68 73 78  Wt. (Lbs) 214 222 216   BMI 29.85 kg/m2 30.96 kg/m2 30.13 kg/m2    Physical Exam Vitals and nursing note reviewed.  Constitutional:      General: He is not in acute distress.    Appearance: Normal appearance.  HENT:     Head: Normocephalic and atraumatic.  Eyes:     General:        Right eye: No discharge.        Left eye: No discharge.     Conjunctiva/sclera: Conjunctivae normal.  Cardiovascular:     Rate and Rhythm: Normal rate and regular rhythm.  Pulmonary:     Effort: Pulmonary effort is normal.     Breath sounds: Normal breath sounds. No wheezing, rhonchi or rales.  Neurological:     Mental Status: He is alert.  Psychiatric:        Mood and Affect: Mood normal.        Behavior: Behavior normal.     Lab Results  Component Value Date   WBC 9.7 08/19/2021   HGB 11.6 (L) 08/19/2021   HCT 37.7 08/19/2021   PLT 290 08/19/2021   GLUCOSE 192 (H)  02/25/2023   CHOL 128 02/19/2023   TRIG 148 02/19/2023   HDL 33 (L) 02/19/2023   LDLCALC 69 02/19/2023   ALT 26 08/19/2021   AST 27 08/19/2021   NA 142 02/25/2023   K 4.3 02/25/2023   CL 105 02/25/2023   CREATININE 1.77 (H) 02/25/2023   BUN 20 02/25/2023   CO2 21 02/25/2023   HGBA1C 10.9 (H) 02/19/2023     Assessment & Plan:   Problem List Items Addressed This Visit       Endocrine   Type 2 diabetes mellitus with stage 3a chronic kidney disease, without long-term current use of insulin (HCC) - Primary   Glycemic control improving following lifestyle changes.  Continue glipizide and Farxiga.  Follow-up in 2 months.      Follow-up:  Return in about 2 months (around 05/27/2023).  Everlene Other DO [facility]

## 2023-04-20 ENCOUNTER — Telehealth: Payer: Self-pay

## 2023-04-20 NOTE — Progress Notes (Signed)
 Care Guide Pharmacy Note  04/20/2023 Name: Andrew Duarte MRN: [id number] DOB: Oct 24, 1965  Referred By: Cook, Jayce G, DO Reason for referral: Care Coordination (TNM Diabetes. )   Andrew Duarte is a 58 y.o. year old male who is a primary care patient of Cook, Jayce G, DO.  Andrew Duarte was referred to the pharmacist for assistance related to: DMII  An unsuccessful telephone outreach was attempted today to contact the patient who was referred to the pharmacy team for assistance with diabetes. Additional attempts will be made to contact the patient.  Dreama [facility] Assistant [phone]

## 2023-04-23 ENCOUNTER — Telehealth: Payer: Self-pay

## 2023-04-23 NOTE — Progress Notes (Signed)
Care Guide Pharmacy Note  04/23/2023 Name: JAHSHAWN SCHULTEIS MRN: [id number] DOB: 03-Dec-1965  Referred By: Tommie Sams, DO Reason for referral: Care Coordination (TNM Diabetes. )   KIMSEY VELARDO is a 58 y.o. year old male who is a primary care patient of Tommie Sams, DO.  BELL RAMONES was referred to the pharmacist for assistance related to: DMII  Successful contact was made with the patient to discuss pharmacy services.  Patient declines engagement at this time. Contact information was provided to the patient should they wish to reach out for assistance at a later time.  [facility]  [facility], [facility] Care Management Assistant Direct Dial: [phone]  Fax: [phone]

## 2023-05-20 ENCOUNTER — Telehealth: Payer: Self-pay | Admitting: *Deleted

## 2023-05-20 DIAGNOSIS — N1831 Chronic kidney disease, stage 3a: Secondary | ICD-10-CM

## 2023-05-20 NOTE — Telephone Encounter (Signed)
Patient was identified as falling into the [place] Measure - Diabetes.   Patient was: Referred to pharmacy for chronic disease management.

## 2023-05-24 ENCOUNTER — Encounter (INDEPENDENT_AMBULATORY_CARE_PROVIDER_SITE_OTHER): Payer: Self-pay | Admitting: *Deleted

## 2023-05-24 ENCOUNTER — Other Ambulatory Visit: Payer: Self-pay | Admitting: Family Medicine

## 2023-05-24 DIAGNOSIS — E785 Hyperlipidemia, unspecified: Secondary | ICD-10-CM | POA: Diagnosis not present

## 2023-05-24 DIAGNOSIS — N1832 Chronic kidney disease, stage 3b: Secondary | ICD-10-CM | POA: Diagnosis not present

## 2023-05-24 DIAGNOSIS — E1122 Type 2 diabetes mellitus with diabetic chronic kidney disease: Secondary | ICD-10-CM

## 2023-05-24 DIAGNOSIS — N1831 Chronic kidney disease, stage 3a: Secondary | ICD-10-CM | POA: Diagnosis not present

## 2023-05-25 LAB — CMP14+EGFR
ALT: 20 [IU]/L (ref 0–44)
AST: 24 [IU]/L (ref 0–40)
Albumin: 4.9 g/dL (ref 3.8–4.9)
Alkaline Phosphatase: 67 [IU]/L (ref 44–121)
BUN/Creatinine Ratio: 11 (ref 9–20)
BUN: 16 mg/dL (ref 6–24)
Bilirubin Total: 0.4 mg/dL (ref 0.0–1.2)
CO2: 21 mmol/L (ref 20–29)
Calcium: 10.2 mg/dL (ref 8.7–10.2)
Chloride: 104 mmol/L (ref 96–106)
Creatinine, Ser: 1.5 mg/dL — ABNORMAL HIGH (ref 0.76–1.27)
Globulin, Total: 3.3 g/dL (ref 1.5–4.5)
Glucose: 64 mg/dL — ABNORMAL LOW (ref 70–99)
Potassium: 4.5 mmol/L (ref 3.5–5.2)
Sodium: 141 mmol/L (ref 134–144)
Total Protein: 8.2 g/dL (ref 6.0–8.5)
eGFR: 53 mL/min/{1.73_m2} — ABNORMAL LOW (ref >59)

## 2023-05-25 LAB — MICROALBUMIN / CREATININE URINE RATIO
Creatinine, Urine: 157 mg/dL
Microalb/Creat Ratio: 69 mg/g{creat} — ABNORMAL HIGH (ref 0–29)
Microalbumin, Urine: 108 ug/mL

## 2023-05-25 LAB — LIPID PANEL
Chol/HDL Ratio: 3.2 {ratio} (ref 0.0–5.0)
Cholesterol, Total: 109 mg/dL (ref 100–199)
HDL: 34 mg/dL — ABNORMAL LOW (ref >39)
LDL Chol Calc (NIH): 56 mg/dL (ref 0–99)
Triglycerides: 98 mg/dL (ref 0–149)
VLDL Cholesterol Cal: 19 mg/dL (ref 5–40)

## 2023-05-25 LAB — CBC
Hematocrit: 36.8 % — ABNORMAL LOW (ref 37.5–51.0)
Hemoglobin: 11.2 g/dL — ABNORMAL LOW (ref 13.0–17.7)
MCH: 22.4 pg — ABNORMAL LOW (ref 26.6–33.0)
MCHC: 30.4 g/dL — ABNORMAL LOW (ref 31.5–35.7)
MCV: 74 fL — ABNORMAL LOW (ref 79–97)
Platelets: 283 10*3/uL (ref 150–450)
RBC: 5 x10E6/uL (ref 4.14–5.80)
RDW: 14.9 % (ref 11.6–15.4)
WBC: 10.7 10*3/uL (ref 3.4–10.8)

## 2023-05-25 LAB — HEMOGLOBIN A1C
Est. average glucose Bld gHb Est-mCnc: 183 mg/dL
Hgb A1c MFr Bld: 8 % — ABNORMAL HIGH (ref 4.8–5.6)

## 2023-05-27 ENCOUNTER — Ambulatory Visit: Payer: BC Managed Care – PPO | Admitting: Family Medicine

## 2023-05-27 VITALS — BP 131/74 | HR 78 | Temp 97.2°F | Ht 71.0 in | Wt 206.0 lb

## 2023-05-27 DIAGNOSIS — I10 Essential (primary) hypertension: Secondary | ICD-10-CM | POA: Diagnosis not present

## 2023-05-27 DIAGNOSIS — E1122 Type 2 diabetes mellitus with diabetic chronic kidney disease: Secondary | ICD-10-CM | POA: Diagnosis not present

## 2023-05-27 DIAGNOSIS — N1831 Chronic kidney disease, stage 3a: Secondary | ICD-10-CM | POA: Diagnosis not present

## 2023-05-27 NOTE — Assessment & Plan Note (Signed)
 Control improving.  Continue current medication.  Okay to cancel appointment with Endo.

## 2023-05-27 NOTE — Patient Instructions (Signed)
 Continue your meds.  Cancel endo appt.  Follow up in 3 months.

## 2023-05-27 NOTE — Progress Notes (Signed)
 Subjective:  Patient ID: Andrew Duarte, male    DOB: Nov 05, 1965  Age: 58 y.o. MRN: [id number]  CC:   Chief Complaint  Patient presents with   Follow-up    2 month f/u     HPI:  58 year old male presents for follow-up.  Patient reports that he is doing well.  Blood sugar levels are well-controlled.  A1c has improved from prior.  Patient is watching his diet closely.  Renal function improved.  Blood pressure stable.  Patient Active Problem List   Diagnosis Date Noted   CKD stage 3b, GFR 30-44 ml/min (HCC) 05/23/2021   Type 2 diabetes mellitus with stage 3a chronic kidney disease, without long-term current use of insulin (HCC) 02/19/2021   Essential hypertension 02/24/2016   Hyperlipidemia 05/03/2015    Social Hx   Social History   Socioeconomic History   Marital status: Single    Spouse name: Not on file   Number of children: Not on file   Years of education: Not on file   Highest education level: Not on file  Occupational History   Not on file  Tobacco Use   Smoking status: Never   Smokeless tobacco: Never  Vaping Use   Vaping status: Never Used  Substance and Sexual Activity   Alcohol use: Not Currently    Alcohol/week: 0.0 standard drinks of alcohol   Drug use: Not Currently   Sexual activity: Not on file  Other Topics Concern   Not on file  Social History Narrative   Not on file   Social Drivers of Health   Financial Resource Strain: Not on file  Food Insecurity: Not on file  Transportation Needs: Not on file  Physical Activity: Not on file  Stress: Not on file  Social Connections: Not on file    Review of Systems  Respiratory: Negative.    Cardiovascular: Negative.    Objective:  BP 131/74   Pulse 78   Temp (!) 97.2 F (36.2 C)   Ht 5\' 11"  (1.803 m)   Wt 206 lb (93.4 kg)   SpO2 100%   BMI 28.73 kg/m      05/27/2023    2:25 PM 03/26/2023    3:03 PM 02/25/2023    3:01 PM  BP/Weight  Systolic BP 131 118 119  Diastolic BP 74 68 73   Wt. (Lbs) 206 214 222  BMI 28.73 kg/m2 29.85 kg/m2 30.96 kg/m2    Physical Exam Vitals and nursing note reviewed.  Constitutional:      General: He is not in acute distress.    Appearance: Normal appearance.  HENT:     Head: Normocephalic and atraumatic.  Cardiovascular:     Rate and Rhythm: Normal rate and regular rhythm.  Pulmonary:     Effort: Pulmonary effort is normal.     Breath sounds: Normal breath sounds. No wheezing, rhonchi or rales.  Neurological:     Mental Status: He is alert.  Psychiatric:        Mood and Affect: Mood normal.        Behavior: Behavior normal.     Lab Results  Component Value Date   WBC 10.7 05/24/2023   HGB 11.2 (L) 05/24/2023   HCT 36.8 (L) 05/24/2023   PLT 283 05/24/2023   GLUCOSE 64 (L) 05/24/2023   CHOL 109 05/24/2023   TRIG 98 05/24/2023   HDL 34 (L) 05/24/2023   LDLCALC 56 05/24/2023   ALT 20 05/24/2023   AST 24 05/24/2023  NA 141 05/24/2023   K 4.5 05/24/2023   CL 104 05/24/2023   CREATININE 1.50 (H) 05/24/2023   BUN 16 05/24/2023   CO2 21 05/24/2023   HGBA1C 8.0 (H) 05/24/2023     Assessment & Plan:  Type 2 diabetes mellitus with stage 3a chronic kidney disease, without long-term current use of insulin (HCC) Assessment & Plan: Control improving.  Continue current medication.  Okay to cancel appointment with Endo.   Essential hypertension Assessment & Plan: Stable.  Continue medications.    Follow-up:  3 months  Janice Seales Adriana Simas DO [facility]

## 2023-05-27 NOTE — Assessment & Plan Note (Addendum)
Stable. Continue medications

## 2023-06-08 ENCOUNTER — Telehealth: Payer: Self-pay

## 2023-06-08 NOTE — Progress Notes (Signed)
 Care Guide Pharmacy Note  06/08/2023 Name: Andrew Duarte MRN: [id number] DOB: November 20, 1965  Referred By: Tommie Sams, DO Reason for referral: Care Coordination (Outreach to schedule with Pharm d )   Andrew Duarte is a 58 y.o. year old male who is a primary care patient of Tommie Sams, DO.  Andrew Duarte was referred to the pharmacist for assistance related to: DMII  An unsuccessful telephone outreach was attempted today to contact the patient who was referred to the pharmacy team for assistance with medication management. Additional attempts will be made to contact the patient.  Penne Lash , RMA     [facility]  [facility], [facility] Guide  Direct Dial: [phone]  Website: [email or web address]

## 2023-06-14 NOTE — Progress Notes (Signed)
 Care Guide Pharmacy Note  06/14/2023 Name: NABIL BUBOLZ MRN: [id number] DOB: 1965/05/24  Referred By: Tommie Sams, DO Reason for referral: Care Coordination (Outreach to schedule with Pharm d )   KELDRICK POMPLUN is a 58 y.o. year old male who is a primary care patient of Tommie Sams, DO.  KAMARRI LOVVORN was referred to the pharmacist for assistance related to: DMII  Successful contact was made with the patient to discuss pharmacy services.  Patient declines engagement at this time. Contact information was provided to the patient should they wish to reach out for assistance at a later time.  Penne Lash , RMA     [facility]  [facility], [facility] Guide  Direct Dial: [phone]  Website: [email or web address]

## 2023-06-17 ENCOUNTER — Ambulatory Visit: Payer: BC Managed Care – PPO | Admitting: Nurse Practitioner

## 2023-06-20 ENCOUNTER — Other Ambulatory Visit: Payer: Self-pay | Admitting: Family Medicine

## 2023-06-21 ENCOUNTER — Other Ambulatory Visit: Payer: Self-pay

## 2023-06-21 MED ORDER — AMLODIPINE BESYLATE 10 MG PO TABS: 10.0000 mg | ORAL_TABLET | Freq: Every day | ORAL | 3 refills | Status: DC

## 2023-06-22 DIAGNOSIS — D631 Anemia in chronic kidney disease: Secondary | ICD-10-CM | POA: Diagnosis not present

## 2023-06-22 DIAGNOSIS — E211 Secondary hyperparathyroidism, not elsewhere classified: Secondary | ICD-10-CM | POA: Diagnosis not present

## 2023-06-22 DIAGNOSIS — R809 Proteinuria, unspecified: Secondary | ICD-10-CM | POA: Diagnosis not present

## 2023-06-22 DIAGNOSIS — N189 Chronic kidney disease, unspecified: Secondary | ICD-10-CM | POA: Diagnosis not present

## 2023-06-23 DIAGNOSIS — D638 Anemia in other chronic diseases classified elsewhere: Secondary | ICD-10-CM | POA: Diagnosis not present

## 2023-06-23 DIAGNOSIS — E1122 Type 2 diabetes mellitus with diabetic chronic kidney disease: Secondary | ICD-10-CM | POA: Diagnosis not present

## 2023-06-23 DIAGNOSIS — E1129 Type 2 diabetes mellitus with other diabetic kidney complication: Secondary | ICD-10-CM | POA: Diagnosis not present

## 2023-06-23 DIAGNOSIS — R809 Proteinuria, unspecified: Secondary | ICD-10-CM | POA: Diagnosis not present

## 2023-08-05 ENCOUNTER — Ambulatory Visit: Admitting: Family Medicine

## 2023-08-11 ENCOUNTER — Encounter (HOSPITAL_COMMUNITY): Payer: Self-pay

## 2023-08-17 DIAGNOSIS — D649 Anemia, unspecified: Secondary | ICD-10-CM | POA: Diagnosis not present

## 2023-08-17 DIAGNOSIS — R809 Proteinuria, unspecified: Secondary | ICD-10-CM | POA: Diagnosis not present

## 2023-08-17 DIAGNOSIS — E211 Secondary hyperparathyroidism, not elsewhere classified: Secondary | ICD-10-CM | POA: Diagnosis not present

## 2023-08-17 DIAGNOSIS — N189 Chronic kidney disease, unspecified: Secondary | ICD-10-CM | POA: Diagnosis not present

## 2023-08-17 DIAGNOSIS — D631 Anemia in chronic kidney disease: Secondary | ICD-10-CM | POA: Diagnosis not present

## 2023-08-18 ENCOUNTER — Encounter: Payer: Self-pay | Admitting: Family Medicine

## 2023-08-18 ENCOUNTER — Ambulatory Visit: Admitting: Family Medicine

## 2023-08-18 VITALS — BP 118/69 | HR 69 | Temp 98.0°F | Ht 71.0 in | Wt 212.0 lb

## 2023-08-18 DIAGNOSIS — N1832 Chronic kidney disease, stage 3b: Secondary | ICD-10-CM

## 2023-08-18 DIAGNOSIS — I10 Essential (primary) hypertension: Secondary | ICD-10-CM | POA: Diagnosis not present

## 2023-08-18 DIAGNOSIS — E1122 Type 2 diabetes mellitus with diabetic chronic kidney disease: Secondary | ICD-10-CM | POA: Diagnosis not present

## 2023-08-18 DIAGNOSIS — E785 Hyperlipidemia, unspecified: Secondary | ICD-10-CM | POA: Diagnosis not present

## 2023-08-18 DIAGNOSIS — N1831 Chronic kidney disease, stage 3a: Secondary | ICD-10-CM

## 2023-08-18 DIAGNOSIS — Z7984 Long term (current) use of oral hypoglycemic drugs: Secondary | ICD-10-CM

## 2023-08-18 MED ORDER — DAPAGLIFLOZIN PROPANEDIOL 10 MG PO TABS
10.0000 mg | ORAL_TABLET | Freq: Every day | ORAL | 1 refills | Status: DC
Start: 2023-08-18 — End: 2024-03-01

## 2023-08-18 MED ORDER — ATORVASTATIN CALCIUM 40 MG PO TABS: ORAL_TABLET | ORAL | 1 refills | Status: DC

## 2023-08-18 MED ORDER — GLIPIZIDE 5 MG PO TABS: ORAL_TABLET | ORAL | 1 refills | Status: DC

## 2023-08-18 MED ORDER — ENALAPRIL MALEATE 20 MG PO TABS: 20.0000 mg | ORAL_TABLET | Freq: Every day | ORAL | 0 refills | Status: AC

## 2023-08-18 NOTE — Assessment & Plan Note (Signed)
 Stable.  Follows with nephrology

## 2023-08-18 NOTE — Patient Instructions (Signed)
 Lab after the 17th.  Follow up in 6 months.

## 2023-08-18 NOTE — Assessment & Plan Note (Signed)
 LDL at goal.  Statin refilled.

## 2023-08-18 NOTE — Assessment & Plan Note (Signed)
 Stable.Continue Norvasc  and Enalapril .

## 2023-08-18 NOTE — Progress Notes (Signed)
 Subjective:  Patient ID: Andrew Duarte, male    DOB: 11-30-1965  Age: 58 y.o. MRN: [id number]  CC:   Chief Complaint  Patient presents with   Follow-up    3  month f/ DM2     HPI:  58 year old male presents for follow-up.  Patient states that his blood sugars are improving.  He is compliant with his medication.  Has been adhering to a better diet.  Lipids stable. Tolerating statin.  BP well controlled.   Patient Active Problem List   Diagnosis Date Noted   CKD stage 3b, GFR 30-44 ml/min (HCC) 05/23/2021   Type 2 diabetes mellitus with stage 3a chronic kidney disease, without long-term current use of insulin (HCC) 02/19/2021   Essential hypertension 02/24/2016   Hyperlipidemia 05/03/2015    Social Hx   Social History   Socioeconomic History   Marital status: Single    Spouse name: Not on file   Number of children: Not on file   Years of education: Not on file   Highest education level: Not on file  Occupational History   Not on file  Tobacco Use   Smoking status: Never   Smokeless tobacco: Never  Vaping Use   Vaping status: Never Used  Substance and Sexual Activity   Alcohol use: Not Currently    Alcohol/week: 0.0 standard drinks of alcohol   Drug use: Not Currently   Sexual activity: Not on file  Other Topics Concern   Not on file  Social History Narrative   Not on file   Social Drivers of Health   Financial Resource Strain: Not on file  Food Insecurity: Not on file  Transportation Needs: Not on file  Physical Activity: Not on file  Stress: Not on file  Social Connections: Not on file    Review of Systems Per HPI  Objective:  BP 118/69   Pulse 69   Temp 98 F (36.7 C)   Ht 5\' 11"  (1.803 m)   Wt 212 lb (96.2 kg)   SpO2 99%   BMI 29.57 kg/m      08/18/2023    3:27 PM 05/27/2023    2:25 PM 03/26/2023    3:03 PM  BP/Weight  Systolic BP 118 131 118  Diastolic BP 69 74 68  Wt. (Lbs) 212 206 214  BMI 29.57 kg/m2 28.73 kg/m2 29.85  kg/m2    Physical Exam Vitals and nursing note reviewed.  Constitutional:      General: He is not in acute distress.    Appearance: Normal appearance.  HENT:     Head: Normocephalic and atraumatic.  Eyes:     General:        Right eye: No discharge.        Left eye: No discharge.     Conjunctiva/sclera: Conjunctivae normal.  Cardiovascular:     Rate and Rhythm: Normal rate and regular rhythm.  Pulmonary:     Effort: Pulmonary effort is normal.     Breath sounds: Normal breath sounds. No wheezing, rhonchi or rales.  Neurological:     Mental Status: He is alert.  Psychiatric:        Mood and Affect: Mood normal.        Behavior: Behavior normal.     Lab Results  Component Value Date   WBC 10.7 05/24/2023   HGB 11.2 (L) 05/24/2023   HCT 36.8 (L) 05/24/2023   PLT 283 05/24/2023   GLUCOSE 64 (L) 05/24/2023   CHOL 109  05/24/2023   TRIG 98 05/24/2023   HDL 34 (L) 05/24/2023   LDLCALC 56 05/24/2023   ALT 20 05/24/2023   AST 24 05/24/2023   NA 141 05/24/2023   K 4.5 05/24/2023   CL 104 05/24/2023   CREATININE 1.50 (H) 05/24/2023   BUN 16 05/24/2023   CO2 21 05/24/2023   HGBA1C 8.0 (H) 05/24/2023     Assessment & Plan:  Type 2 diabetes mellitus with stage 3a chronic kidney disease, without long-term current use of insulin (HCC) Assessment & Plan: Improving. Needs A1c after 5/17. A1c ordered today. Continue Farxiga  and Glipizide .   Orders: -     Hemoglobin A1c -     glipiZIDE ; TAKE 1 TABLET BY MOUTH EVERY DAY BEFORE BREAKFAST  Dispense: 90 tablet; Refill: 1 -     Dapagliflozin  Propanediol; Take 1 tablet (10 mg total) by mouth daily before breakfast.  Dispense: 90 tablet; Refill: 1  Essential hypertension Assessment & Plan: Stable.Continue Norvasc  and Enalapril .   Orders: -     Enalapril  Maleate; Take 1 tablet (20 mg total) by mouth daily with supper.  Dispense: 90 tablet; Refill: 0  Hyperlipidemia, unspecified hyperlipidemia type Assessment & Plan: LDL at  goal.  Statin refilled.  Orders: -     Atorvastatin  Calcium ; TAKE 1 TABLET BY MOUTH EVERYDAY AT BEDTIME  Dispense: 90 tablet; Refill: 1  CKD stage 3b, GFR 30-44 ml/min (HCC) Assessment & Plan: Stable.  Follows with nephrology.  Orders: -     Dapagliflozin  Propanediol; Take 1 tablet (10 mg total) by mouth daily before breakfast.  Dispense: 90 tablet; Refill: 1   Follow-up:  6 months  Thad Osoria Debrah Fan DO [facility]

## 2023-08-18 NOTE — Assessment & Plan Note (Signed)
 Improving. Needs A1c after 5/17. A1c ordered today. Continue Farxiga  and Glipizide .

## 2023-09-15 DIAGNOSIS — N1831 Chronic kidney disease, stage 3a: Secondary | ICD-10-CM | POA: Diagnosis not present

## 2023-09-15 DIAGNOSIS — R809 Proteinuria, unspecified: Secondary | ICD-10-CM | POA: Diagnosis not present

## 2023-09-15 DIAGNOSIS — E1122 Type 2 diabetes mellitus with diabetic chronic kidney disease: Secondary | ICD-10-CM | POA: Diagnosis not present

## 2023-09-15 DIAGNOSIS — E1129 Type 2 diabetes mellitus with other diabetic kidney complication: Secondary | ICD-10-CM | POA: Diagnosis not present

## 2023-12-27 ENCOUNTER — Telehealth: Payer: Self-pay

## 2023-12-27 NOTE — Telephone Encounter (Signed)
 Spoke with pharmacy.  Insurance will only cover a 30 day supply.  [facility] will process refill.

## 2023-12-27 NOTE — Telephone Encounter (Signed)
 Copied from CRM [id number]. Topic: Clinical - Prescription Issue >> Dec 27, 2023  4:32 PM Fonda T wrote: Reason for CRM: Patient calling due to multiple medication refills attempts for medication.   Medication: dapagliflozin  propanediol (FARXIGA ) 10 MG TABS tablet  Patient reports has refills on medication, but pharmacy is not filling medication, advised to contact office. Patient reports only has one more pill left, and then he will be out. Also states he has requested refill for pharmacy online multiple times, with no response.   Patient needs 90 day supply for medication.  Patient requesting a follow up call once been sent to pharmacy, can be reached at [phone].  Patient is aware, of response by next business day.    This is the patient's preferred pharmacy:  [id number] [place], [place] - [address] AT [facility] [address] [place] [address] Phone: [phone] Fax: [phone]

## 2024-02-17 DIAGNOSIS — E1122 Type 2 diabetes mellitus with diabetic chronic kidney disease: Secondary | ICD-10-CM | POA: Diagnosis not present

## 2024-02-18 ENCOUNTER — Ambulatory Visit: Payer: Self-pay | Admitting: Family Medicine

## 2024-02-18 LAB — HEMOGLOBIN A1C
Est. average glucose Bld gHb Est-mCnc: 280 mg/dL
Hgb A1c MFr Bld: 11.4 % — ABNORMAL HIGH (ref 4.8–5.6)

## 2024-02-18 NOTE — Progress Notes (Signed)
 Called patient and left voicemail to give us  a call back regarding his lab results.

## 2024-02-21 ENCOUNTER — Ambulatory Visit: Admitting: Family Medicine

## 2024-02-21 ENCOUNTER — Encounter: Payer: Self-pay | Admitting: Family Medicine

## 2024-02-21 VITALS — BP 94/59 | HR 98 | Temp 97.7°F | Ht 71.0 in | Wt 213.0 lb

## 2024-02-21 DIAGNOSIS — Z1211 Encounter for screening for malignant neoplasm of colon: Secondary | ICD-10-CM

## 2024-02-21 DIAGNOSIS — I10 Essential (primary) hypertension: Secondary | ICD-10-CM | POA: Diagnosis not present

## 2024-02-21 DIAGNOSIS — N1831 Chronic kidney disease, stage 3a: Secondary | ICD-10-CM

## 2024-02-21 DIAGNOSIS — E1122 Type 2 diabetes mellitus with diabetic chronic kidney disease: Secondary | ICD-10-CM | POA: Diagnosis not present

## 2024-02-21 MED ORDER — AMLODIPINE BESYLATE 5 MG PO TABS: 5.0000 mg | ORAL_TABLET | Freq: Every day | ORAL | 3 refills | Status: AC

## 2024-02-21 NOTE — Progress Notes (Signed)
 Subjective:  Patient ID: Andrew Duarte, male    DOB: [birth year]  Age: 58 y.o. MRN: [id number]  CC:   Chief Complaint  Patient presents with   Diabetes   Hypertension    HPI:  58 year old male presents for follow-up  Patient states that he is feeling well.  Denies chest pain or shortness of breath.  A1c done on 11/13.  A1c now has risen significantly to 11.4 from 8.0.  Patient believes that this is from a dietary indiscretion.  He has been drinking a sweetened lemonade frequently.  He remains on Farxiga  and glipizide .  Will discuss today.  BP low here today at 94/59.  He is currently on amlodipine  10 mg daily and enalapril  20 mg daily.  Follows closely with nephrology.  Needs foot exam today.  Needs referral for colonoscopy.  Needs eye exam.  Declines immunizations.  Patient Active Problem List   Diagnosis Date Noted   CKD stage 3b, GFR 30-44 ml/min (HCC) 05/23/2021   Type 2 diabetes mellitus with stage 3a chronic kidney disease, without long-term current use of insulin (HCC) 02/19/2021   Essential hypertension 02/24/2016   Hyperlipidemia 05/03/2015    Social Hx   Social History   Socioeconomic History   Marital status: Single    Spouse name: Not on file   Number of children: Not on file   Years of education: Not on file   Highest education level: Not on file  Occupational History   Not on file  Tobacco Use   Smoking status: Never   Smokeless tobacco: Never  Vaping Use   Vaping status: Never Used  Substance and Sexual Activity   Alcohol use: Not Currently    Alcohol/week: 0.0 standard drinks of alcohol   Drug use: Not Currently   Sexual activity: Not on file  Other Topics Concern   Not on file  Social History Narrative   Not on file   Social Drivers of Health   Financial Resource Strain: Not on file  Food Insecurity: Not on file  Transportation Needs: Not on file  Physical Activity: Not on file  Stress: Not on file  Social Connections: Not on file     Review of Systems Per HPI  Objective:  BP (!) 94/59   Pulse 98   Temp 97.7 F (36.5 C)   Ht 5' 11 (1.803 m)   Wt 213 lb (96.6 kg)   SpO2 98%   BMI 29.71 kg/m      02/21/2024    3:23 PM 08/18/2023    3:27 PM 05/27/2023    2:25 PM  BP/Weight  Systolic BP 94 118 131  Diastolic BP 59 69 74  Wt. (Lbs) 213 212 206  BMI 29.71 kg/m2 29.57 kg/m2 28.73 kg/m2    Physical Exam Vitals and nursing note reviewed.  Constitutional:      General: He is not in acute distress.    Appearance: Normal appearance.  HENT:     Head: Normocephalic and atraumatic.  Eyes:     General:        Right eye: No discharge.        Left eye: No discharge.     Conjunctiva/sclera: Conjunctivae normal.  Cardiovascular:     Rate and Rhythm: Normal rate and regular rhythm.  Pulmonary:     Effort: Pulmonary effort is normal.     Breath sounds: Normal breath sounds. No wheezing, rhonchi or rales.  Feet:     Comments: Diabetic Foot Check -  Appearance - no lesions, ulcers or calluses Skin - no unusual pallor or redness Monofilament testing -  Right - Great toe, medial, central, lateral ball and posterior foot intact Left - Great toe, medial, central, lateral ball and posterior foot intact  Neurological:     Mental Status: He is alert.  Psychiatric:        Mood and Affect: Mood normal.        Behavior: Behavior normal.     Lab Results  Component Value Date   WBC 10.7 05/24/2023   HGB 11.2 (L) 05/24/2023   HCT 36.8 (L) 05/24/2023   PLT 283 05/24/2023   GLUCOSE 64 (L) 05/24/2023   CHOL 109 05/24/2023   TRIG 98 05/24/2023   HDL 34 (L) 05/24/2023   LDLCALC 56 05/24/2023   ALT 20 05/24/2023   AST 24 05/24/2023   NA 141 05/24/2023   K 4.5 05/24/2023   CL 104 05/24/2023   CREATININE 1.50 (H) 05/24/2023   BUN 16 05/24/2023   CO2 21 05/24/2023   HGBA1C 11.4 (H) 02/17/2024     Assessment & Plan:  Type 2 diabetes mellitus with stage 3a chronic kidney disease, without long-term current  use of insulin (HCC) Assessment & Plan: Uncontrolled and worsening.  Patient does not wish to make any changes at this time other than dietary changes.  He is going to cut out the sweetened beverages.  Follow-up in 3 months.   Encounter for screening colonoscopy -     Ambulatory referral to Gastroenterology  Essential hypertension Assessment & Plan: BP low here today.  Decreasing amlodipine  to 5 mg daily.  Orders: -     amLODIPine  Besylate; Take 1 tablet (5 mg total) by mouth daily.  Dispense: 90 tablet; Refill: 3    Follow-up:  3 months  Denali Becvar Bluford DO [facility]

## 2024-02-21 NOTE — Patient Instructions (Signed)
 Decrease amlodipine  to 5 mg day (1/2).  Referring for colonoscopy.  Follow up in 3 months.

## 2024-02-21 NOTE — Assessment & Plan Note (Signed)
 BP low here today.  Decreasing amlodipine  to 5 mg daily.

## 2024-02-21 NOTE — Assessment & Plan Note (Signed)
 Uncontrolled and worsening.  Patient does not wish to make any changes at this time other than dietary changes.  He is going to cut out the sweetened beverages.  Follow-up in 3 months.

## 2024-02-22 ENCOUNTER — Encounter (INDEPENDENT_AMBULATORY_CARE_PROVIDER_SITE_OTHER): Payer: Self-pay | Admitting: *Deleted

## 2024-03-01 ENCOUNTER — Other Ambulatory Visit: Payer: Self-pay | Admitting: Family Medicine

## 2024-03-01 DIAGNOSIS — N1832 Chronic kidney disease, stage 3b: Secondary | ICD-10-CM

## 2024-03-01 DIAGNOSIS — N1831 Chronic kidney disease, stage 3a: Secondary | ICD-10-CM

## 2024-03-01 MED ORDER — DAPAGLIFLOZIN PROPANEDIOL 10 MG PO TABS: 10.0000 mg | ORAL_TABLET | Freq: Every day | ORAL | 1 refills | Status: AC

## 2024-03-01 NOTE — Telephone Encounter (Signed)
 Copied from CRM [id number]. Topic: Clinical - Medication Refill >> Mar 01, 2024 10:20 AM Jakyia R wrote: Medication: dapagliflozin  propanediol (FARXIGA ) 10 MG TABS tablet  Has the patient contacted their pharmacy? Yes (Agent: If no, request that the patient contact the pharmacy for the refill. If patient does not wish to contact the pharmacy document the reason why and proceed with request.) (Agent: If yes, when and what did the pharmacy advise?)  This is the patient's preferred pharmacy:  [id number] - [place], [place] - [address] AT [facility] [address] [place] [address] Phone: [phone] Fax: [phone]  Is this the correct pharmacy for this prescription? Yes If no, delete pharmacy and type the correct one.   Has the prescription been filled recently? Yes  Is the patient out of the medication? Yes  Has the patient been seen for an appointment in the last year OR does the patient have an upcoming appointment? Yes  Can we respond through MyChart? Yes  Agent: Please be advised that Rx refills may take up to 3 business days. We ask that you follow-up with your pharmacy.

## 2024-03-09 DIAGNOSIS — I10 Essential (primary) hypertension: Secondary | ICD-10-CM | POA: Diagnosis not present

## 2024-03-09 DIAGNOSIS — D631 Anemia in chronic kidney disease: Secondary | ICD-10-CM | POA: Diagnosis not present

## 2024-03-09 DIAGNOSIS — E119 Type 2 diabetes mellitus without complications: Secondary | ICD-10-CM | POA: Diagnosis not present

## 2024-03-09 DIAGNOSIS — N189 Chronic kidney disease, unspecified: Secondary | ICD-10-CM | POA: Diagnosis not present

## 2024-03-13 DIAGNOSIS — N1832 Chronic kidney disease, stage 3b: Secondary | ICD-10-CM | POA: Diagnosis not present

## 2024-03-13 DIAGNOSIS — E1129 Type 2 diabetes mellitus with other diabetic kidney complication: Secondary | ICD-10-CM | POA: Diagnosis not present

## 2024-03-13 DIAGNOSIS — R809 Proteinuria, unspecified: Secondary | ICD-10-CM | POA: Diagnosis not present

## 2024-03-13 DIAGNOSIS — E1122 Type 2 diabetes mellitus with diabetic chronic kidney disease: Secondary | ICD-10-CM | POA: Diagnosis not present

## 2024-04-11 DIAGNOSIS — E785 Hyperlipidemia, unspecified: Secondary | ICD-10-CM

## 2024-04-23 ENCOUNTER — Other Ambulatory Visit: Payer: Self-pay | Admitting: Family Medicine

## 2024-04-23 DIAGNOSIS — E1122 Type 2 diabetes mellitus with diabetic chronic kidney disease: Secondary | ICD-10-CM

## 2024-05-23 ENCOUNTER — Ambulatory Visit: Admitting: Family Medicine
